# Patient Record
Sex: Female | Born: 1985 | Race: Asian | Hispanic: No | Marital: Single | Smoking: Never smoker
Health system: Southern US, Community
[De-identification: ages and names within clinical notes are randomized; demographics above are authoritative.]

---

## 2004-10-14 ENCOUNTER — Emergency Department (HOSPITAL_COMMUNITY): Admission: EM | Admit: 2004-10-14 | Discharge: 2004-10-15 | Payer: Self-pay | Admitting: Emergency Medicine

## 2005-08-02 ENCOUNTER — Emergency Department (HOSPITAL_COMMUNITY): Admission: EM | Admit: 2005-08-02 | Discharge: 2005-08-02 | Payer: Self-pay | Admitting: Emergency Medicine

## 2006-03-02 ENCOUNTER — Emergency Department (HOSPITAL_COMMUNITY): Admission: EM | Admit: 2006-03-02 | Discharge: 2006-03-02 | Payer: Self-pay | Admitting: Emergency Medicine

## 2006-04-27 ENCOUNTER — Emergency Department (HOSPITAL_COMMUNITY): Admission: EM | Admit: 2006-04-27 | Discharge: 2006-04-27 | Payer: Self-pay | Admitting: Emergency Medicine

## 2006-10-07 ENCOUNTER — Emergency Department (HOSPITAL_COMMUNITY): Admission: EM | Admit: 2006-10-07 | Discharge: 2006-10-07 | Payer: Self-pay | Admitting: Emergency Medicine

## 2007-07-23 ENCOUNTER — Emergency Department (HOSPITAL_COMMUNITY): Admission: EM | Admit: 2007-07-23 | Discharge: 2007-07-24 | Payer: Self-pay | Admitting: Emergency Medicine

## 2010-12-29 ENCOUNTER — Emergency Department (HOSPITAL_COMMUNITY)
Admission: EM | Admit: 2010-12-29 | Discharge: 2010-12-29 | Disposition: A | Discharge status: Home / Self Care | Payer: Self-pay | Attending: Emergency Medicine | Admitting: Emergency Medicine

## 2010-12-29 ENCOUNTER — Emergency Department (HOSPITAL_COMMUNITY): Payer: Self-pay

## 2010-12-29 DIAGNOSIS — R0789 Other chest pain: Secondary | ICD-10-CM | POA: Insufficient documentation

## 2010-12-29 DIAGNOSIS — R1031 Right lower quadrant pain: Secondary | ICD-10-CM | POA: Insufficient documentation

## 2010-12-29 DIAGNOSIS — B9689 Other specified bacterial agents as the cause of diseases classified elsewhere: Secondary | ICD-10-CM | POA: Insufficient documentation

## 2010-12-29 DIAGNOSIS — N949 Unspecified condition associated with female genital organs and menstrual cycle: Secondary | ICD-10-CM | POA: Insufficient documentation

## 2010-12-29 DIAGNOSIS — N760 Acute vaginitis: Secondary | ICD-10-CM | POA: Insufficient documentation

## 2010-12-29 DIAGNOSIS — R071 Chest pain on breathing: Secondary | ICD-10-CM | POA: Insufficient documentation

## 2010-12-29 DIAGNOSIS — A499 Bacterial infection, unspecified: Secondary | ICD-10-CM | POA: Insufficient documentation

## 2010-12-29 LAB — DIFFERENTIAL
Basophils Absolute: 0 10*3/uL (ref 0.0–0.1)
Lymphocytes Relative: 29 % (ref 12–46)
Monocytes Absolute: 0.4 10*3/uL (ref 0.1–1.0)
Monocytes Relative: 6 % (ref 3–12)
Neutro Abs: 4.3 10*3/uL (ref 1.7–7.7)

## 2010-12-29 LAB — CBC
HCT: 41.6 % (ref 36.0–46.0)
Hemoglobin: 14.4 g/dL (ref 12.0–15.0)
MCH: 32.5 pg (ref 26.0–34.0)
MCHC: 34.6 g/dL (ref 30.0–36.0)

## 2010-12-29 LAB — URINALYSIS, ROUTINE W REFLEX MICROSCOPIC
Bilirubin Urine: NEGATIVE
Hgb urine dipstick: NEGATIVE
Specific Gravity, Urine: 1.027 (ref 1.005–1.030)
pH: 6 (ref 5.0–8.0)

## 2010-12-29 LAB — POCT I-STAT, CHEM 8
BUN: 12 mg/dL (ref 6–23)
Chloride: 104 mEq/L (ref 96–112)
Creatinine, Ser: 0.9 mg/dL (ref 0.50–1.10)
Glucose, Bld: 88 mg/dL (ref 70–99)
Potassium: 3.4 mEq/L — ABNORMAL LOW (ref 3.5–5.1)

## 2010-12-29 LAB — WET PREP, GENITAL
Trich, Wet Prep: NONE SEEN
Yeast Wet Prep HPF POC: NONE SEEN

## 2010-12-29 LAB — CK TOTAL AND CKMB (NOT AT ARMC): CK, MB: 1.9 ng/mL (ref 0.3–4.0)

## 2010-12-29 LAB — TROPONIN I: Troponin I: <0.3 ng/mL (ref <0.30)

## 2010-12-30 LAB — GC/CHLAMYDIA PROBE AMP, GENITAL: GC Probe Amp, Genital: NEGATIVE

## 2011-03-17 LAB — POCT CARDIAC MARKERS
CKMB, poc: <1 — ABNORMAL LOW
CKMB, poc: <1 — ABNORMAL LOW
Myoglobin, poc: 16.2
Operator id: 4661
Operator id: 4661
Troponin i, poc: <0.05
Troponin i, poc: <0.05

## 2011-03-17 LAB — DIFFERENTIAL
Lymphocytes Relative: 29
Lymphs Abs: 5.2 — ABNORMAL HIGH
Monocytes Relative: 6
Neutro Abs: 11.5 — ABNORMAL HIGH
Neutrophils Relative %: 64

## 2011-03-17 LAB — CBC
MCV: 93.8
Platelets: 340
RBC: 4.59
WBC: 18 — ABNORMAL HIGH

## 2011-03-17 LAB — BASIC METABOLIC PANEL
CO2: 26
Calcium: 9.5
GFR calc non Af Amer: >60
Sodium: 137

## 2011-03-17 LAB — RAPID URINE DRUG SCREEN, HOSP PERFORMED
Benzodiazepines: NOT DETECTED
Tetrahydrocannabinol: POSITIVE — AB

## 2011-03-17 LAB — D-DIMER, QUANTITATIVE

## 2012-07-18 ENCOUNTER — Emergency Department (HOSPITAL_COMMUNITY): Payer: Self-pay

## 2012-07-18 ENCOUNTER — Encounter (HOSPITAL_COMMUNITY): Payer: Self-pay

## 2012-07-18 ENCOUNTER — Emergency Department (HOSPITAL_COMMUNITY)
Admission: EM | Admit: 2012-07-18 | Discharge: 2012-07-18 | Disposition: A | Discharge status: Home / Self Care | Payer: Self-pay | Attending: Emergency Medicine | Admitting: Emergency Medicine

## 2012-07-18 DIAGNOSIS — J069 Acute upper respiratory infection, unspecified: Secondary | ICD-10-CM | POA: Insufficient documentation

## 2012-07-18 DIAGNOSIS — R059 Cough, unspecified: Secondary | ICD-10-CM | POA: Insufficient documentation

## 2012-07-18 DIAGNOSIS — R05 Cough: Secondary | ICD-10-CM | POA: Insufficient documentation

## 2012-07-18 DIAGNOSIS — F17200 Nicotine dependence, unspecified, uncomplicated: Secondary | ICD-10-CM | POA: Insufficient documentation

## 2012-07-18 DIAGNOSIS — M542 Cervicalgia: Secondary | ICD-10-CM | POA: Insufficient documentation

## 2012-07-18 DIAGNOSIS — R0602 Shortness of breath: Secondary | ICD-10-CM | POA: Insufficient documentation

## 2012-07-18 DIAGNOSIS — J3489 Other specified disorders of nose and nasal sinuses: Secondary | ICD-10-CM | POA: Insufficient documentation

## 2012-07-18 MED ORDER — ALBUTEROL SULFATE HFA 108 (90 BASE) MCG/ACT IN AERS
2.0000 | INHALATION_SPRAY | Freq: Once | RESPIRATORY_TRACT | Status: AC
  Administered 2012-07-18: 2 via RESPIRATORY_TRACT
  Filled 2012-07-18: qty 6.7

## 2012-07-18 MED ORDER — IPRATROPIUM BROMIDE 0.03 % NA SOLN: 2.0000 | Freq: Two times a day (BID) | NASAL | Status: DC

## 2012-07-18 MED ORDER — GUAIFENESIN ER 600 MG PO TB12: 1200.0000 mg | ORAL_TABLET | Freq: Two times a day (BID) | ORAL | Status: DC

## 2012-07-18 MED ORDER — BENZONATATE 100 MG PO CAPS
200.0000 mg | ORAL_CAPSULE | Freq: Two times a day (BID) | ORAL | Status: DC | PRN
Start: 2012-07-18 — End: 2021-07-12

## 2012-07-18 NOTE — ED Provider Notes (Signed)
History   Scribed for Desiree Anger, DO, the patient was seen in room TR07C/TR07C . This chart was scribed by Lewanda Rife.   CSN: [id number]  Arrival date & time 07/18/12  1758   First MD Initiated Contact with Patient 07/18/12 1957      Chief Complaint  Patient presents with  . URI    (Consider location/radiation/quality/duration/timing/severity/associated sxs/prior treatment) HPI Keona Bilyeu is a 27 y.o. female without known past medical Hx who presents to the Emergency Department complaining of cough and congestion onset 3 days. Pt reports waxing and waning moderate headache, chest wall pain with inspiration, and, non-productive cough, difficulty breathing. Pt reports pain radiates to ribs and neck when taking a deep breath. Pt describes pain as burning and tightness and rated at a 7/10 in severity. Pt denies fever, nausea, vomiting, diarrhea, urinary symptoms, sore throat, and myalgias. Pt denies taking any medications to treat symptoms at home. Nothing makes it better and nothing makes it worse.  Pt reports her friend is sick with URI symptoms.   History reviewed. No pertinent past medical history.  History reviewed. No pertinent past surgical history.  No family history on file.  History  Substance Use Topics  . Smoking status: Current Every Day Smoker  . Smokeless tobacco: Not on file  . Alcohol Use: Yes    OB History    Grav Para Term Preterm Abortions TAB SAB Ect Mult Living                  Review of Systems  Constitutional: Negative for fever and chills.  HENT: Positive for neck pain. Negative for sore throat.   Respiratory: Positive for cough and shortness of breath.   Gastrointestinal: Negative for nausea, vomiting, abdominal pain and diarrhea.  Genitourinary: Negative.  Negative for dysuria and hematuria.  Musculoskeletal:       Chest wall pain  All other systems reviewed and are negative.    Allergies  Review of patient's allergies  indicates no known allergies.  Home Medications   Current Outpatient Rx  Name  Route  Sig  Dispense  Refill  . BENZONATATE 100 MG PO CAPS   Oral   Take 2 capsules (200 mg total) by mouth 2 (two) times daily as needed for cough.   20 capsule   0   . GUAIFENESIN ER 600 MG PO TB12   Oral   Take 2 tablets (1,200 mg total) by mouth 2 (two) times daily.   30 tablet   0   . IPRATROPIUM BROMIDE 0.03 % NA SOLN   Nasal   Place 2 sprays into the nose every 12 (twelve) hours.   30 mL   12     BP 118/81  Pulse 85  Temp 98 F (36.7 C) (Oral)  Resp 16  SpO2 100%  LMP 06/27/2012  Physical Exam  Nursing note and vitals reviewed. Constitutional: She is oriented to person, place, and time. She appears well-developed and well-nourished. No distress.  HENT:  Head: Normocephalic and atraumatic.  Right Ear: External ear and ear canal normal. Tympanic membrane is not erythematous and not bulging.  Left Ear: Tympanic membrane, external ear and ear canal normal. Tympanic membrane is not erythematous and not bulging.  Nose: Mucosal edema and rhinorrhea present. No epistaxis. Right sinus exhibits no maxillary sinus tenderness and no frontal sinus tenderness. Left sinus exhibits no maxillary sinus tenderness and no frontal sinus tenderness.  Mouth/Throat: Uvula is midline and mucous membranes are normal. Mucous  membranes are not pale and not cyanotic. Posterior oropharyngeal erythema (mild ) present. No oropharyngeal exudate, posterior oropharyngeal edema or tonsillar abscesses.  Eyes: Conjunctivae normal are normal. Pupils are equal, round, and reactive to light.  Neck: Normal range of motion and full passive range of motion without pain. Neck supple.       No meningismus noted on exam.  Cardiovascular: Normal rate and intact distal pulses.   Pulmonary/Chest: Effort normal and breath sounds normal. No stridor. No respiratory distress. She has no wheezes. She has no rales. She exhibits tenderness.           Breath sounds are diminished and coarse  Abdominal: Soft. Bowel sounds are normal. There is tenderness (mild) in the right upper quadrant, epigastric area and left upper quadrant.  Musculoskeletal: Normal range of motion.  Lymphadenopathy:    She has no cervical adenopathy.  Neurological: She is alert and oriented to person, place, and time.  Skin: Skin is warm and dry. No rash noted. She is not diaphoretic.  Psychiatric: She has a normal mood and affect.    ED Course  Procedures (including critical care time)  Labs Reviewed - No data to display Dg Chest 2 View  07/18/2012  *RADIOLOGY REPORT*  Clinical Data: Cough and shortness of breath  CHEST - 2 VIEW  Comparison:  December 29, 2010  Findings:  Lungs clear.  Heart size and pulmonary vascularity are normal.  No adenopathy.  No bone lesions.  MPRESSION: No abnormality noted.   Original Report Authenticated By: Bretta Bang, M.D.      1. Viral URI with cough       MDM  Noland Fordyce presents with URI symptoms and sick contacts.  Pt CXR negative for acute infiltrate. Patients symptoms are consistent with URI, likely viral etiology. Patient given albuterol treatment here in the department with subjective improvement. Increased catabolic a.m. and decreased coarseness of breath sounds reassessment. Discussed that antibiotics are not indicated for viral infections. Pt will be discharged with symptomatic treatment.  Verbalizes understanding and is agreeable with plan. Pt is hemodynamically stable & in NAD prior to dc.  I have also discussed reasons to return immediately to the ER.  Patient expresses understanding and agrees with plan.   1. Medications: naprosyn, Zofran usual home medications 2. Treatment: rest, drink plenty of fluids, take medications as prescribed 3. Follow Up: Please followup with your primary doctor for discussion of your diagnoses and further evaluation after today's visit; if you do not have a primary care doctor  use the resource guide provided to find one  I personally performed the services described in this documentation, which was scribed in my presence. The recorded information has been reviewed and is accurate.   Dahlia Client Don Giarrusso, PA-C 07/18/12 2149

## 2012-07-18 NOTE — ED Notes (Signed)
Chest congestion, cough, neck  Pain, stuffy nose.

## 2012-07-19 NOTE — ED Provider Notes (Signed)
Medical screening examination/treatment/procedure(s) were performed by non-physician practitioner and as supervising physician I was immediately available for consultation/collaboration.   Laray Anger, DO 07/19/12 1610

## 2021-01-19 ENCOUNTER — Emergency Department (HOSPITAL_COMMUNITY): Payer: BC Managed Care – PPO

## 2021-01-19 ENCOUNTER — Emergency Department (HOSPITAL_COMMUNITY)
Admission: EM | Admit: 2021-01-19 | Discharge: 2021-01-19 | Discharge status: Against Medical Advice | Payer: BC Managed Care – PPO | Attending: Emergency Medicine | Admitting: Emergency Medicine

## 2021-01-19 ENCOUNTER — Other Ambulatory Visit: Payer: Self-pay

## 2021-01-19 DIAGNOSIS — R519 Headache, unspecified: Secondary | ICD-10-CM | POA: Insufficient documentation

## 2021-01-19 DIAGNOSIS — R102 Pelvic and perineal pain: Secondary | ICD-10-CM | POA: Insufficient documentation

## 2021-01-19 DIAGNOSIS — R079 Chest pain, unspecified: Secondary | ICD-10-CM | POA: Diagnosis present

## 2021-01-19 LAB — BASIC METABOLIC PANEL
Anion gap: 8 (ref 5–15)
BUN: 12 mg/dL (ref 6–20)
CO2: 23 mmol/L (ref 22–32)
Calcium: 9.3 mg/dL (ref 8.9–10.3)
Chloride: 103 mmol/L (ref 98–111)
Creatinine, Ser: 0.82 mg/dL (ref 0.44–1.00)
GFR, Estimated: >60 mL/min (ref >60)
Glucose, Bld: 93 mg/dL (ref 70–99)
Potassium: 3.6 mmol/L (ref 3.5–5.1)
Sodium: 134 mmol/L — ABNORMAL LOW (ref 135–145)

## 2021-01-19 LAB — I-STAT BETA HCG BLOOD, ED (MC, WL, AP ONLY): I-stat hCG, quantitative: <5 m[IU]/mL (ref <5)

## 2021-01-19 LAB — CBC
HCT: 46.2 % — ABNORMAL HIGH (ref 36.0–46.0)
Hemoglobin: 15.2 g/dL — ABNORMAL HIGH (ref 12.0–15.0)
MCH: 31.6 pg (ref 26.0–34.0)
MCHC: 32.9 g/dL (ref 30.0–36.0)
MCV: 96 fL (ref 80.0–100.0)
Platelets: 343 10*3/uL (ref 150–400)
RBC: 4.81 MIL/uL (ref 3.87–5.11)
RDW: 12.5 % (ref 11.5–15.5)
WBC: 4.6 10*3/uL (ref 4.0–10.5)
nRBC: 0 % (ref 0.0–0.2)

## 2021-01-19 LAB — TROPONIN I (HIGH SENSITIVITY): Troponin I (High Sensitivity): <2 ng/L (ref <18)

## 2021-01-19 NOTE — ED Provider Notes (Signed)
She willEmergency Medicine Provider Triage Evaluation Note  Shylin Keizer , a 35 y.o. female  was evaluated in triage.  Pt complains of presents with chest pain, general body aches, headaches,.  This started last night, but has gotten worse, she denies shortness of breath, chest pain does not radiate, has no cardiac history, no history of PEs or DVTs, currently not on oral birth control, denies any illicit drug use.  Patient does not vaccines COVID-19, denies recent sick contacts..  Review of Systems  Positive: Chest pain, general body aches Negative: Shortness of breath, abdominal pain  Physical Exam  BP 114/82 (BP Location: Left Arm)   Pulse 91   Temp 98.9 F (37.2 C) (Oral)   Resp 16   Ht 5\' 7"  (1.702 m)   Wt 72.6 kg   LMP 12/20/2020 (Approximate)   SpO2 100%   BMI 25.06 kg/m  Gen:   Awake, no distress   Resp:  Normal effort  MSK:   Moves extremities without difficulty  Other:    Medical Decision Making  Medically screening exam initiated at 1:16 PM.  Appropriate orders placed.  Ayah Cozzolino was informed that the remainder of the evaluation will be completed by another provider, this initial triage assessment does not replace that evaluation, and the importance of remaining in the ED until their evaluation is complete.  Patient presents with chest pain general body aches patient will need further work-up here in the emergency department.   Noland Fordyce, PA-C 01/19/21 1317    01/21/21, MD 01/19/21 [id number]

## 2021-01-19 NOTE — ED Notes (Signed)
Pt called in ED lobby for vitals, no answer x1. ENMiles  

## 2021-01-19 NOTE — ED Notes (Signed)
Pt called for vitals reassessment in ED lobby. No answer x2. ENMiles  

## 2021-01-19 NOTE — ED Triage Notes (Signed)
Patient c/o left chest pain since last night and states worse today. Patient also reports generalized body aches and headache since last night.

## 2021-01-19 NOTE — ED Notes (Signed)
Pt called for vitals reassessment in ED lobby. No answer x3. ENMiles  

## 2021-06-15 ENCOUNTER — Ambulatory Visit: Payer: Self-pay | Admitting: Nurse Practitioner

## 2021-07-01 ENCOUNTER — Telehealth: Payer: Self-pay | Admitting: Family Medicine

## 2021-07-01 DIAGNOSIS — J020 Streptococcal pharyngitis: Secondary | ICD-10-CM

## 2021-07-01 MED ORDER — PENICILLIN V POTASSIUM 500 MG PO TABS
500.0000 mg | ORAL_TABLET | Freq: Two times a day (BID) | ORAL | 0 refills | Status: AC
Start: 2021-07-01 — End: 2021-07-11

## 2021-07-01 NOTE — Patient Instructions (Addendum)
Strep Throat, Adult Strep throat is an infection of the throat. It is caused by germs (bacteria). Strep throat is common during the cold months of the year. It mostly affects children who are 68-37 years old. However, people of all ages can get it at any time of the year. This infection spreads from person to person through coughing, sneezing, or having close contact. What are the causes? This condition is caused by the Streptococcus pyogenes germ. What increases the risk? You care for young children. Children are more likely to get strep throat and may spread it to others. You go to crowded places. Germs can spread easily in such places. You kiss or touch someone who has strep throat. What are the signs or symptoms? Fever or chills. Redness, swelling, or pain in the tonsils or throat. Pain or trouble when swallowing. White or yellow spots on the tonsils or throat. Tender glands in the neck and under the jaw. Bad breath. Red rash all over the body. This is rare. How is this treated? Medicines that kill germs (antibiotics). Medicines that treat pain or fever. These include: Ibuprofen or acetaminophen. Aspirin, only for people who are over the age of 33. Cough drops. Throat sprays. Follow these instructions at home: Medicines  Take over-the-counter and prescription medicines only as told by your doctor. Take your antibiotic medicine as told by your doctor. Do not stop taking the antibiotic even if you start to feel better. Eating and drinking  If you have trouble swallowing, eat soft foods until your throat feels better. Drink enough fluid to keep your pee (urine) pale yellow. To help with pain, you may have: Warm fluids, such as soup and tea. Cold fluids, such as frozen desserts or popsicles. General instructions Rinse your mouth (gargle) with a salt-water mixture 3-4 times a day or as needed. To make a salt-water mixture, dissolve -1 tsp (3-6 g) of salt in 1 cup (237 mL) of warm  water. Rest as much as you can. Stay home from work or school until you have been taking antibiotics for 24 hours. Do not smoke or use any products that contain nicotine or tobacco. If you need help quitting, ask your doctor. Keep all follow-up visits. How is this prevented?  Do not share food, drinking cups, or personal items. They can cause the germs to spread. Wash your hands well with soap and water. Make sure that all people in your house wash their hands well. Have family members tested if they have a fever or a sore throat. They may need an antibiotic if they have strep throat. Contact a doctor if: You have swelling in your neck that keeps getting bigger. You get a rash, cough, or earache. You cough up a thick fluid that is green, yellow-brown, or bloody. You have pain that does not get better with medicine. Your symptoms get worse instead of getting better. You have a fever. Get help right away if: You vomit. You have a very bad headache. Your neck hurts or feels stiff. You have chest pain or are short of breath. You have drooling, very bad throat pain, or changes in your voice. Your neck is swollen, or the skin gets red and tender. Your mouth is dry, or you are peeing less than normal. You keep feeling more tired or have trouble waking up. Your joints are red or painful. These symptoms may be an emergency. Do not wait to see if the symptoms will go away. Get help right away. Call  your local emergency services (911 in the [place] Summary Strep throat is an infection of the throat. It is caused by germs (bacteria). This infection can spread from person to person through coughing, sneezing, or having close contact. Take your medicines, including antibiotics, as told by your doctor. Do not stop taking the antibiotic even if you start to feel better. To prevent the spread of germs, wash your hands well with soap and water. Have others do the same. Do not share food, drinking cups,  or personal items. Get help right away if you have a bad headache, chest pain, shortness of breath, a stiff or painful neck, or you vomit. This information is not intended to replace advice given to you by your health care provider. Make sure you discuss any questions you have with your health care provider. Document Revised: 10/06/2020 Document Reviewed: 10/06/2020 Elsevier Patient Education  [place].   Penicillin V Tablets What is this medication? PENICILLIN V (pen i SILL in V) is a penicillin antibiotic. It treats some infections caused by bacteria. It will not work for colds, the flu, or other viruses. This medicine may be used for other purposes; ask your health care provider or pharmacist if you have questions. COMMON BRAND NAME(S): Bayard Beaver, Veetids What should I tell my care team before I take this medication? They need to know if you have any of these conditions: asthma bowel disease, like colitis eczema kidney disease an unusual or allergic reaction to penicillin, cephalosporins, other antibiotics or medicines, foods, tartrazine or other dyes, or preservatives pregnant or trying to get pregnant breast-feeding How should I use this medication? Take this drug by mouth. Take it as directed on the prescription label at the same time every day. You can take it with or without food. If it upsets your stomach, take it with food. Take all of this drug unless your health care provider tells you to stop it early. Keep taking it even if you think you are better. Talk to your health care provider about the use of this drug in children. While it may be prescribed for children as young as 12 for selected conditions, precautions do apply. Overdosage: If you think you have taken too much of this medicine contact a poison control center or emergency room at once. NOTE: This medicine is only for you. Do not share this medicine with others. What if I miss a dose? If you miss a dose,  take it as soon as you can. If it is almost time for your next dose, take only that dose. Do not take double or extra doses. What may interact with this medication? birth control pills methotrexate other antibiotics probenecid some vaccines This list may not describe all possible interactions. Give your health care provider a list of all the medicines, herbs, non-prescription drugs, or dietary supplements you use. Also tell them if you smoke, drink alcohol, or use illegal drugs. Some items may interact with your medicine. What should I watch for while using this medication? Tell your doctor or health care provider if your symptoms do not improve. This medicine may cause serious skin reactions. They can happen weeks to months after starting the medicine. Contact your health care provider right away if you notice fevers or flu-like symptoms with a rash. The rash may be red or purple and then turn into blisters or peeling of the skin. Or, you might notice a red rash with swelling of the face, lips or lymph nodes in  your neck or under your arms. Do not treat diarrhea with over the counter products. Contact your doctor if you have diarrhea that lasts more than 2 days or if it is severe and watery. If you have diabetes, you may get a false-positive result for sugar in your urine. Check with your doctor or health care provider. Birth control pills may not work properly while you are taking this medicine. Talk to your doctor about using an extra method of birth control. What side effects may I notice from receiving this medication? Side effects that you should report to your doctor or health care professional as soon as possible: allergic reactions like skin rash or hives, swelling of the face, lips, or tongue breathing problems fever new symptoms of infection redness, blistering, peeling or loosening of the skin, including inside the mouth unusually weak or tired Side effects that usually do not  require medical attention (report to your doctor or health care professional if they continue or are bothersome): diarrhea headache nausea, vomiting sore mouth or tongue stomach upset This list may not describe all possible side effects. Call your doctor for medical advice about side effects. You may report side effects to FDA at 1-800-FDA-1088. Where should I keep my medication? Keep out of the reach of children and pets. Store at room temperature between 20 and 25 degrees C (68 and 77 degrees F). Throw away any unused drug after the expiration date. NOTE: This sheet is a summary. It may not cover all possible information. If you have questions about this medicine, talk to your doctor, pharmacist, or health care provider.  2022 Elsevier/Gold Standard (2019-03-07 00:00:00)

## 2021-07-01 NOTE — Progress Notes (Signed)
Virtual Visit Consent   Desiree Murray, you are scheduled for a virtual visit with a [facility] provider today.     Just as with appointments in the office, your consent must be obtained to participate.  Your consent will be active for this visit and any virtual visit you may have with one of our providers in the next 365 days.     If you have a MyChart account, a copy of this consent can be sent to you electronically.  All virtual visits are billed to your insurance company just like a traditional visit in the office.    As this is a virtual visit, video technology does not allow for your provider to perform a traditional examination.  This may limit your provider's ability to fully assess your condition.  If your provider identifies any concerns that need to be evaluated in person or the need to arrange testing (such as labs, EKG, etc.), we will make arrangements to do so.     Although advances in technology are sophisticated, we cannot ensure that it will always work on either your end or our end.  If the connection with a video visit is poor, the visit may have to be switched to a telephone visit.  With either a video or telephone visit, we are not always able to ensure that we have a secure connection.     I need to obtain your verbal consent now.   Are you willing to proceed with your visit today?    [name] has provided verbal consent on 07/01/2021 for a virtual visit (video or telephone).   Freddy Finner, NP   Date: 07/01/2021 9:23 AM   Virtual Visit via Video Note   I, Freddy Finner, connected with  Desiree Murray  ([id number], [birth year]) on 07/01/21 at  9:30 AM EST by a video-enabled telemedicine application and verified that I am speaking with the correct person using two identifiers.  Location: Patient: Virtual Visit Location Patient: Home Provider: Virtual Visit Location Provider: Home Office   I discussed the limitations of evaluation and management by telemedicine and the  availability of in person appointments. The patient expressed understanding and agreed to proceed.    History of Present Illness: Desiree Murray is a 36 y.o. who identifies as a female who was assigned female at birth, and is being seen today for possible strep- sore throat.    HPI: Sore Throat  This is a new problem. The current episode started in the past 7 days. The problem has been gradually worsening. Neither side of throat is experiencing more pain than the other. Maximum temperature: unsure. The pain is at a severity of 10/10. Associated symptoms include headaches, neck pain, swollen glands and trouble swallowing. Pertinent negatives include no congestion, coughing, drooling, ear pain, shortness of breath or stridor. She has had no exposure to strep or mono. She has tried acetaminophen and cool liquids for the symptoms. The treatment provided mild relief.     Problems: There are no problems to display for this patient.   Allergies: No Known Allergies Medications:  Current Outpatient Medications:    benzonatate (TESSALON) 100 MG capsule, Take 2 capsules (200 mg total) by mouth 2 (two) times daily as needed for cough., Disp: 20 capsule, Rfl: 0   guaiFENesin (MUCINEX) 600 MG 12 hr tablet, Take 2 tablets (1,200 mg total) by mouth 2 (two) times daily., Disp: 30 tablet, Rfl: 0   ipratropium (ATROVENT) 0.03 % nasal spray, Place 2  sprays into the nose every 12 (twelve) hours., Disp: 30 mL, Rfl: 12  Observations/Objective: Patient is well-developed, well-nourished in no acute distress.  Resting comfortably  at home.  Head is normocephalic, atraumatic.  No labored breathing.  Speech is clear and coherent with logical content.  Patient is alert and oriented at baseline.    Assessment and Plan:  1. Strep pharyngitis  - penicillin v potassium (VEETID) 500 MG tablet; Take 1 tablet (500 mg total) by mouth 2 (two) times daily for 10 days.  Dispense: 20 tablet; Refill: 0  -unknown exposure-  classic presentation, will Tx with above and OTC reviewed and on AVS   Reviewed side effects, risks and benefits of medication.     Patient acknowledged agreement and understanding of the plan.     Follow Up Instructions: I discussed the assessment and treatment plan with the patient. The patient was provided an opportunity to ask questions and all were answered. The patient agreed with the plan and demonstrated an understanding of the instructions.  A copy of instructions were sent to the patient via MyChart unless otherwise noted below.    The patient was advised to call back or seek an in-person evaluation if the symptoms worsen or if the condition fails to improve as anticipated.  Time:  I spent 12 minutes with the patient via telehealth technology discussing the above problems/concerns.    Freddy Finner, NP

## 2021-07-07 ENCOUNTER — Other Ambulatory Visit: Payer: Self-pay

## 2021-07-07 ENCOUNTER — Encounter: Payer: Self-pay | Admitting: Nurse Practitioner

## 2021-07-07 ENCOUNTER — Ambulatory Visit (INDEPENDENT_AMBULATORY_CARE_PROVIDER_SITE_OTHER): Payer: PRIVATE HEALTH INSURANCE | Admitting: Nurse Practitioner

## 2021-07-07 VITALS — BP 106/70 | HR 74 | Temp 98.1°F | Ht 67.0 in | Wt 159.5 lb

## 2021-07-07 DIAGNOSIS — R110 Nausea: Secondary | ICD-10-CM

## 2021-07-07 DIAGNOSIS — G43009 Migraine without aura, not intractable, without status migrainosus: Secondary | ICD-10-CM | POA: Diagnosis not present

## 2021-07-07 DIAGNOSIS — Z7689 Persons encountering health services in other specified circumstances: Secondary | ICD-10-CM | POA: Diagnosis not present

## 2021-07-07 DIAGNOSIS — N926 Irregular menstruation, unspecified: Secondary | ICD-10-CM | POA: Diagnosis not present

## 2021-07-07 DIAGNOSIS — Z0000 Encounter for general adult medical examination without abnormal findings: Secondary | ICD-10-CM

## 2021-07-07 DIAGNOSIS — N951 Menopausal and female climacteric states: Secondary | ICD-10-CM

## 2021-07-07 DIAGNOSIS — Z113 Encounter for screening for infections with a predominantly sexual mode of transmission: Secondary | ICD-10-CM

## 2021-07-07 MED ORDER — ELETRIPTAN HYDROBROMIDE 20 MG PO TABS: 20.0000 mg | ORAL_TABLET | ORAL | 0 refills | Status: DC | PRN

## 2021-07-07 MED ORDER — ONDANSETRON 4 MG PO TBDP: 4.0000 mg | ORAL_TABLET | Freq: Three times a day (TID) | ORAL | 1 refills | Status: DC | PRN

## 2021-07-07 NOTE — Progress Notes (Signed)
New Patient Office Visit  Subjective:  Patient ID: Desiree Murray, female    DOB: Aug 14, 1985  Age: 36 y.o. MRN: [id number]  CC:  Chief Complaint  Patient presents with   Establish Care    HPI Briyonna Omara presents to establish new primary care provider. She states that she has headaches and nausea nearly every day. She states that the headaches just come out of no where. She states that OTC medications don't generally work. The nausea that accompanies nearly every headache is overwhelming.  Has irritation of the skin of her vaginal area. This has been going on for several years. Back in 2017, she was given steroids cream. Which helped some. This got better, then worse. She states that the cream she had initially been given is not working like it used to. She is due to have a routine physical exam.  He does have routine, fasting labs.  She would like to be screened for sexually transmitted infections.  History reviewed. No pertinent past medical history.  History reviewed. No pertinent surgical history.  History reviewed. No pertinent family history.  Social History   Socioeconomic History   Marital status: Single    Spouse name: Not on file   Number of children: Not on file   Years of education: Not on file   Highest education level: Not on file  Occupational History   Not on file  Tobacco Use   Smoking status: Never   Smokeless tobacco: Not on file  Substance and Sexual Activity   Alcohol use: Yes   Drug use: No   Sexual activity: Yes    Birth control/protection: None  Other Topics Concern   Not on file  Social History Narrative   Not on file   Social Determinants of Health   Financial Resource Strain: Not on file  Food Insecurity: Not on file  Transportation Needs: Not on file  Physical Activity: Not on file  Stress: Not on file  Social Connections: Not on file  Intimate Partner Violence: Not on file    ROS Review of Systems  Constitutional:  Positive for  fatigue. Negative for activity change, appetite change, chills and fever.  HENT:  Negative for congestion, postnasal drip, rhinorrhea, sinus pressure, sinus pain, sneezing and sore throat.   Eyes: Negative.   Respiratory:  Negative for cough, chest tightness, shortness of breath and wheezing.   Cardiovascular:  Negative for chest pain and palpitations.  Gastrointestinal:  Positive for nausea and vomiting. Negative for abdominal pain, constipation and diarrhea.       Nausea and vomiting accompanying migraine headaches.  Endocrine: Negative for cold intolerance, heat intolerance, polydipsia and polyuria.  Genitourinary:  Negative for dyspareunia, dysuria, flank pain, frequency and urgency.       Vaginal itching and irritation.  Musculoskeletal:  Negative for arthralgias, back pain and myalgias.  Skin:  Negative for rash.  Allergic/Immunologic: Negative for environmental allergies.  Neurological:  Positive for dizziness and headaches. Negative for weakness.  Hematological:  Negative for adenopathy.  Psychiatric/Behavioral:  The patient is nervous/anxious.    Objective:   Today's Vitals   07/07/21 1146  BP: 106/70  Pulse: 74  Temp: 98.1 F (36.7 C)  SpO2: 99%  Weight: 159 lb 8 oz (72.3 kg)  Height: $Remove'5\' 7"'HbtFDHr$  (1.702 m)   Body mass index is 24.98 kg/m.   Physical Exam Vitals and nursing note reviewed.  Constitutional:      Appearance: Normal appearance. She is well-developed.  HENT:  Head: Normocephalic and atraumatic.  Eyes:     Pupils: Pupils are equal, round, and reactive to light.  Cardiovascular:     Rate and Rhythm: Normal rate and regular rhythm.     Pulses: Normal pulses.     Heart sounds: Normal heart sounds.  Pulmonary:     Effort: Pulmonary effort is normal.     Breath sounds: Normal breath sounds.  Abdominal:     Palpations: Abdomen is soft.  Musculoskeletal:        General: Normal range of motion.     Cervical back: Normal range of motion and neck supple.   Lymphadenopathy:     Cervical: No cervical adenopathy.  Skin:    General: Skin is warm and dry.     Capillary Refill: Capillary refill takes less than 2 seconds.  Neurological:     General: No focal deficit present.     Mental Status: She is alert and oriented to person, place, and time.  Psychiatric:        Mood and Affect: Mood normal.        Behavior: Behavior normal.        Thought Content: Thought content normal.        Judgment: Judgment normal.    Assessment & Plan:  1. Encounter to establish care Appointment today to establish new primary care provider    2. Migraine without aura and without status migrainosus, not intractable Patient reports having headaches typically every day.  We will do trial of Relpax 20 mg tablets.  Advised to take first tablet at headache onset.  May repeat dose after 2 hours if headache is persistent.  She should not take more than 2 tablets in 24 hours.  We will reassess headache frequency at next visit. - eletriptan (RELPAX) 20 MG tablet; headaches 1 tablet (20 mg total) by mouth as needed for migraine or headache. May repeat in 2 hours if headache persists or recurs.  Dispense: 10 tablet; Refill: 0  3. Nausea Nausea associated with migraine headaches.  May take Zofran 3 to 4 mg up to 3 times daily if needed for nausea. - ondansetron (ZOFRAN-ODT) 4 MG disintegrating tablet; Take 1 tablet (4 mg total) by mouth every 8 (eight) hours as needed for nausea or vomiting.  Dispense: 30 tablet; Refill: 1  4. Irregular menstrual cycle Patient having irregular menstrual cycles.  We will check reproductive hormones and discuss potential treatments once labs are back.  5. Perimenopause Will check reproductive hormones for further evaluation. - FSH/LH; Future - Estradiol; Future - Estradiol - FSH/LH  6. Screening for STD (sexually transmitted disease) Check HIV and RPR.  Check gonorrhea, chlamydia, and trichomonas at next visit. - HIV antibody (with  reflex); Future - RPR; Future - HIV antibody (with reflex) - RPR  7. Health care maintenance Check all routine, fasting labs today. - Hemoglobin A1c; Future - TSH; Future - Lipid panel; Future - Comp Met (CMET); Future - CBC; Future - CBC - Comp Met (CMET) - Lipid panel - TSH - Hemoglobin A1c   Problem List Items Addressed This Visit       Cardiovascular and Mediastinum   Migraine without aura and without status migrainosus, not intractable   Relevant Medications   eletriptan (RELPAX) 20 MG tablet     Other   Nausea   Relevant Medications   ondansetron (ZOFRAN-ODT) 4 MG disintegrating tablet   Irregular menstrual cycle   Perimenopause   Relevant Orders   FSH/LH (Completed)  Estradiol (Completed)   Other Visit Diagnoses     Encounter to establish care    -  Primary   Screening for STD (sexually transmitted disease)       Relevant Orders   HIV antibody (with reflex) (Completed)   RPR (Completed)   Health care maintenance       Relevant Orders   Hemoglobin A1c (Completed)   TSH (Completed)   Lipid panel (Completed)   Comp Met (CMET) (Completed)   CBC (Completed)       Outpatient Encounter Medications as of 07/07/2021  Medication Sig   eletriptan (RELPAX) 20 MG tablet Take 1 tablet (20 mg total) by mouth as needed for migraine or headache. May repeat in 2 hours if headache persists or recurs.   ondansetron (ZOFRAN-ODT) 4 MG disintegrating tablet Take 1 tablet (4 mg total) by mouth every 8 (eight) hours as needed for nausea or vomiting.   [EXPIRED] penicillin v potassium (VEETID) 500 MG tablet Take 1 tablet (500 mg total) by mouth 2 (two) times daily for 10 days.   benzonatate (TESSALON) 100 MG capsule Take 2 capsules (200 mg total) by mouth 2 (two) times daily as needed for cough.   guaiFENesin (MUCINEX) 600 MG 12 hr tablet Take 2 tablets (1,200 mg total) by mouth 2 (two) times daily.   ipratropium (ATROVENT) 0.03 % nasal spray Place 2 sprays into the nose  every 12 (twelve) hours.   No facility-administered encounter medications on file as of 07/07/2021.    Follow-up: Return in about 2 weeks (around 07/21/2021) for health maintenance exam, with pap.   Ronnell Freshwater, NP  This note was dictated using Systems analyst. Rapid proofreading was performed to expedite the delivery of the information. Despite proofreading, phonetic errors will occur which are common with this voice recognition software. Please take this into consideration. If there are any concerns, please contact our office.

## 2021-07-08 ENCOUNTER — Telehealth: Payer: Self-pay | Admitting: Nurse Practitioner

## 2021-07-08 LAB — LIPID PANEL
Chol/HDL Ratio: 5.5 ratio — ABNORMAL HIGH (ref 0.0–4.4)
Cholesterol, Total: 188 mg/dL (ref 100–199)
HDL: 34 mg/dL — ABNORMAL LOW (ref >39)
LDL Chol Calc (NIH): 120 mg/dL — ABNORMAL HIGH (ref 0–99)
Triglycerides: 189 mg/dL — ABNORMAL HIGH (ref 0–149)
VLDL Cholesterol Cal: 34 mg/dL (ref 5–40)

## 2021-07-08 LAB — CBC
Hematocrit: 44.5 % (ref 34.0–46.6)
Hemoglobin: 15.2 g/dL (ref 11.1–15.9)
MCH: 31.9 pg (ref 26.6–33.0)
MCHC: 34.2 g/dL (ref 31.5–35.7)
MCV: 94 fL (ref 79–97)
Platelets: 502 10*3/uL — ABNORMAL HIGH (ref 150–450)
RBC: 4.76 x10E6/uL (ref 3.77–5.28)
RDW: 12.5 % (ref 11.7–15.4)
WBC: 8.9 10*3/uL (ref 3.4–10.8)

## 2021-07-08 LAB — HIV ANTIBODY (ROUTINE TESTING W REFLEX): HIV Screen 4th Generation wRfx: NONREACTIVE

## 2021-07-08 LAB — RPR: RPR Ser Ql: NONREACTIVE

## 2021-07-08 LAB — COMPREHENSIVE METABOLIC PANEL
ALT: 15 IU/L (ref 0–32)
AST: 17 IU/L (ref 0–40)
Albumin/Globulin Ratio: 1.6 (ref 1.2–2.2)
Albumin: 4.6 g/dL (ref 3.8–4.8)
Alkaline Phosphatase: 65 IU/L (ref 44–121)
BUN/Creatinine Ratio: 10 (ref 9–23)
BUN: 8 mg/dL (ref 6–20)
Bilirubin Total: 0.3 mg/dL (ref 0.0–1.2)
CO2: 24 mmol/L (ref 20–29)
Calcium: 9.8 mg/dL (ref 8.7–10.2)
Chloride: 100 mmol/L (ref 96–106)
Creatinine, Ser: 0.84 mg/dL (ref 0.57–1.00)
Globulin, Total: 2.9 g/dL (ref 1.5–4.5)
Glucose: 86 mg/dL (ref 70–99)
Potassium: 5.5 mmol/L — ABNORMAL HIGH (ref 3.5–5.2)
Sodium: 138 mmol/L (ref 134–144)
Total Protein: 7.5 g/dL (ref 6.0–8.5)
eGFR: 92 mL/min/{1.73_m2} (ref >59)

## 2021-07-08 LAB — TSH: TSH: 0.437 u[IU]/mL — ABNORMAL LOW (ref 0.450–4.500)

## 2021-07-08 LAB — FSH/LH
FSH: 4.2 m[IU]/mL
LH: 9.8 m[IU]/mL

## 2021-07-08 LAB — HEMOGLOBIN A1C
Est. average glucose Bld gHb Est-mCnc: 111 mg/dL
Hgb A1c MFr Bld: 5.5 % (ref 4.8–5.6)

## 2021-07-08 LAB — ESTRADIOL: Estradiol: 158 pg/mL

## 2021-07-08 NOTE — Telephone Encounter (Signed)
Appointment scheduled.

## 2021-07-08 NOTE — Progress Notes (Signed)
Will review results with patient at visit 07/21/2021. Not menopausal. May ned referral to GYN and endocrinology. RPR and HIV both negative.

## 2021-07-08 NOTE — Telephone Encounter (Signed)
Patient left vm saying she had some questions about lab results that came to her MyChart. Can you please call her? [phone]

## 2021-07-09 ENCOUNTER — Other Ambulatory Visit: Payer: Self-pay

## 2021-07-09 ENCOUNTER — Telehealth (INDEPENDENT_AMBULATORY_CARE_PROVIDER_SITE_OTHER): Payer: PRIVATE HEALTH INSURANCE | Admitting: Nurse Practitioner

## 2021-07-09 ENCOUNTER — Encounter: Payer: Self-pay | Admitting: Nurse Practitioner

## 2021-07-09 VITALS — Ht 67.0 in | Wt 159.5 lb

## 2021-07-09 DIAGNOSIS — E782 Mixed hyperlipidemia: Secondary | ICD-10-CM | POA: Diagnosis not present

## 2021-07-09 DIAGNOSIS — E875 Hyperkalemia: Secondary | ICD-10-CM | POA: Diagnosis not present

## 2021-07-09 DIAGNOSIS — R7989 Other specified abnormal findings of blood chemistry: Secondary | ICD-10-CM | POA: Diagnosis not present

## 2021-07-09 NOTE — Progress Notes (Signed)
Virtual Visit via Video Note  I connected with Desiree Murray on 07/09/21 at  9:20 AM EST by a video enabled telemedicine application and verified that I am speaking with the correct person using two identifiers.  Location: Patient: home Provider: [place] [facility] at [facility]     I discussed the limitations of evaluation and management by telemedicine and the availability of in person appointments. The patient expressed understanding and agreed to proceed.  History of Present Illness: The patient presents for follow up visit regarding her labs. She had fasting lipids drawn. In general, liid panel was elevated with an increase in risk for cardiovascular disease.   Lipid Panel     Component Value Date/Time   CHOL 188 07/07/2021 1153   TRIG 189 (H) 07/07/2021 1153   HDL 34 (L) 07/07/2021 1153   CHOLHDL 5.5 (H) 07/07/2021 1153   LDLCALC 120 (H) 07/07/2021 1153   LABVLDL 34 07/07/2021 1153    Potassium was elevated at 5.5 with the remainder of her CMP normal.  Platelet count was elevated at 502.  Her TSH was slightly low at 0.437 Other labs were within normal limits. The patient has no physical concerns or complaints.    Observations/Objective:  The patient is alert and oriented. She is pleasant and answers all questions appropriately. Breathing is non-labored. She is in no acute distress at this time.    Today's Vitals   07/09/21 0908  Weight: 159 lb 8 oz (72.3 kg)  Height: 5\' 7"  (1.702 m)   Body mass index is 24.98 kg/m.   Assessment and Plan: 1. Mixed hyperlipidemia Discussed high cholesterol panel with the patient. Encouraged her to limit her intake of fried and fatty foods and increase intake of lean proteins and green leafy vegetables. I have also recommended she increase physical activity. We will conitnue to monitor this closely and reconsider addition of statins if indicated in the future.   2. Abnormal TSH TSH mildly low at 0.437. will recheck this at  patient's next visit which is 07/21/2021.   3. High platelet count Platelet count 503. Will recheck CBC at next visit   4. Hyperkalemia Potassium level 5.5 with the remainder of CMP being normal. Will recheck BMP at next visit on 07/21/2021.    Follow Up Instructions:    I discussed the assessment and treatment plan with the patient. The patient was provided an opportunity to ask questions and all were answered. The patient agreed with the plan and demonstrated an understanding of the instructions.   The patient was advised to call back or seek an in-person evaluation if the symptoms worsen or if the condition fails to improve as anticipated.  I provided 15 minutes of non-face-to-face time during this encounter.   07/23/2021, NP

## 2021-07-12 DIAGNOSIS — R110 Nausea: Secondary | ICD-10-CM | POA: Insufficient documentation

## 2021-07-12 DIAGNOSIS — N926 Irregular menstruation, unspecified: Secondary | ICD-10-CM | POA: Insufficient documentation

## 2021-07-12 DIAGNOSIS — G43009 Migraine without aura, not intractable, without status migrainosus: Secondary | ICD-10-CM | POA: Insufficient documentation

## 2021-07-12 DIAGNOSIS — N951 Menopausal and female climacteric states: Secondary | ICD-10-CM | POA: Insufficient documentation

## 2021-07-21 ENCOUNTER — Other Ambulatory Visit (HOSPITAL_COMMUNITY)
Admission: RE | Admit: 2021-07-21 | Discharge: 2021-07-21 | Disposition: A | Discharge status: Home / Self Care | Payer: PRIVATE HEALTH INSURANCE | Source: Ambulatory Visit | Attending: Nurse Practitioner | Admitting: Nurse Practitioner

## 2021-07-21 ENCOUNTER — Encounter: Payer: Self-pay | Admitting: Nurse Practitioner

## 2021-07-21 ENCOUNTER — Other Ambulatory Visit: Payer: Self-pay

## 2021-07-21 ENCOUNTER — Ambulatory Visit (INDEPENDENT_AMBULATORY_CARE_PROVIDER_SITE_OTHER): Payer: PRIVATE HEALTH INSURANCE | Admitting: Nurse Practitioner

## 2021-07-21 VITALS — BP 118/69 | HR 60 | Temp 98.1°F | Ht 67.0 in | Wt 162.6 lb

## 2021-07-21 DIAGNOSIS — G43009 Migraine without aura, not intractable, without status migrainosus: Secondary | ICD-10-CM

## 2021-07-21 DIAGNOSIS — R7989 Other specified abnormal findings of blood chemistry: Secondary | ICD-10-CM

## 2021-07-21 DIAGNOSIS — Z01419 Encounter for gynecological examination (general) (routine) without abnormal findings: Secondary | ICD-10-CM | POA: Insufficient documentation

## 2021-07-21 DIAGNOSIS — E663 Overweight: Secondary | ICD-10-CM

## 2021-07-21 DIAGNOSIS — E875 Hyperkalemia: Secondary | ICD-10-CM | POA: Diagnosis not present

## 2021-07-21 DIAGNOSIS — L209 Atopic dermatitis, unspecified: Secondary | ICD-10-CM

## 2021-07-21 MED ORDER — ELETRIPTAN HYDROBROMIDE 40 MG PO TABS: 40.0000 mg | ORAL_TABLET | ORAL | 1 refills | Status: AC | PRN

## 2021-07-21 MED ORDER — CLOBETASOL PROP EMOLLIENT BASE 0.05 % EX CREA: TOPICAL_CREAM | CUTANEOUS | 2 refills | Status: AC

## 2021-07-21 NOTE — Assessment & Plan Note (Signed)
Increase relpax to 40mg  as needed for migraine headaches.

## 2021-07-21 NOTE — Progress Notes (Signed)
Complete physical exam   Patient: Desiree Murray   DOB: 1985/09/19   36 y.o. Female  MRN: [id number] Visit Date: 07/21/2021    Chief Complaint  Patient presents with   Annual Exam   Gynecologic Exam   Subjective    Desiree Murray is a 36 y.o. female who presents today for a complete physical exam.  She reports consuming a general diet. The patient does not participate in regular exercise at present. She generally feels fairly well. She does have additional problems to discuss today.   HPI  States that she had intercourse yesterday. States that she had some spotting after that. Did not have cramping or other concerning symptoms.  Feels fatigued most of the time  We had reviewed routine, fasting labs prior tothis visit. Will check full thyroid panel today and BMP as potassium level was 5.5.  Was able to take a relpax for migraine headaches. She states that it has helped some, but has had to take [facility] powder with this as well.   History reviewed. No pertinent past medical history. History reviewed. No pertinent surgical history.  Social History   Socioeconomic History   Marital status: Single    Spouse name: Not on file   Number of children: Not on file   Years of education: Not on file   Highest education level: Not on file  Occupational History   Not on file  Tobacco Use   Smoking status: Never   Smokeless tobacco: Not on file  Substance and Sexual Activity   Alcohol use: Yes   Drug use: No   Sexual activity: Yes    Birth control/protection: None  Other Topics Concern   Not on file  Social History Narrative   Not on file   Social Determinants of Health   Financial Resource Strain: Not on file  Food Insecurity: Not on file  Transportation Needs: Not on file  Physical Activity: Not on file  Stress: Not on file  Social Connections: Not on file  Intimate Partner Violence: Not on file   No family status information on file.   History reviewed. No pertinent family  history. No Known Allergies  Patient Care Team: Ronnell Freshwater, NP as PCP - General (Family Medicine)   Medications: Outpatient Medications Prior to Visit  Medication Sig   ondansetron (ZOFRAN-ODT) 4 MG disintegrating tablet Take 1 tablet (4 mg total) by mouth every 8 (eight) hours as needed for nausea or vomiting.   [DISCONTINUED] eletriptan (RELPAX) 20 MG tablet Take 1 tablet (20 mg total) by mouth as needed for migraine or headache. May repeat in 2 hours if headache persists or recurs.   No facility-administered medications prior to visit.    Review of Systems  Constitutional:  Positive for fatigue. Negative for activity change, appetite change, chills and fever.  HENT:  Negative for congestion, postnasal drip, rhinorrhea, sinus pressure, sinus pain, sneezing and sore throat.   Eyes: Negative.   Respiratory:  Negative for cough, chest tightness, shortness of breath and wheezing.   Cardiovascular:  Negative for chest pain and palpitations.  Gastrointestinal:  Negative for abdominal pain, constipation, diarrhea, nausea and vomiting.  Endocrine: Negative for cold intolerance, heat intolerance, polydipsia and polyuria.  Genitourinary:  Positive for menstrual problem. Negative for dyspareunia, dysuria, flank pain, frequency and urgency.       Had some spotting after intercourse yesterday   Musculoskeletal:  Negative for arthralgias, back pain and myalgias.  Skin:  Negative for rash.  Allergic/Immunologic: Negative for  environmental allergies.  Neurological:  Positive for headaches. Negative for dizziness and weakness.  Hematological:  Negative for adenopathy.  Psychiatric/Behavioral:  The patient is not nervous/anxious.    Last metabolic panel Lab Results  Component Value Date   GLUCOSE 86 07/07/2021   NA 138 07/07/2021   K 5.5 (H) 07/07/2021   CL 100 07/07/2021   CO2 24 07/07/2021   BUN 8 07/07/2021   CREATININE 0.84 07/07/2021   EGFR 92 07/07/2021   CALCIUM 9.8 07/07/2021    PROT 7.5 07/07/2021   ALBUMIN 4.6 07/07/2021   LABGLOB 2.9 07/07/2021   AGRATIO 1.6 07/07/2021   BILITOT 0.3 07/07/2021   ALKPHOS 65 07/07/2021   AST 17 07/07/2021   ALT 15 07/07/2021   ANIONGAP 8 01/19/2021   Last thyroid functions Lab Results  Component Value Date   TSH 0.437 (L) 07/07/2021       Objective     Today's Vitals   07/21/21 0813  BP: 118/69  Pulse: 60  Temp: 98.1 F (36.7 C)  SpO2: 99%  Weight: 162 lb 9.6 oz (73.8 kg)  Height: $Remove'5\' 7"'QkzSTqp$  (1.702 m)   Body mass index is 25.47 kg/m.   BP Readings from Last 3 Encounters:  07/21/21 118/69  07/07/21 106/70  01/19/21 110/73    Wt Readings from Last 3 Encounters:  07/21/21 162 lb 9.6 oz (73.8 kg)  07/09/21 159 lb 8 oz (72.3 kg)  07/07/21 159 lb 8 oz (72.3 kg)     Physical Exam Vitals and nursing note reviewed. Exam conducted with a chaperone present.  Constitutional:      Appearance: Normal appearance. She is well-developed.  HENT:     Head: Normocephalic and atraumatic.     Right Ear: Tympanic membrane, ear canal and external ear normal.     Left Ear: Tympanic membrane, ear canal and external ear normal.     Nose: Nose normal.     Mouth/Throat:     Mouth: Mucous membranes are moist.     Pharynx: Oropharynx is clear.  Eyes:     Extraocular Movements: Extraocular movements intact.     Conjunctiva/sclera: Conjunctivae normal.     Pupils: Pupils are equal, round, and reactive to light.  Cardiovascular:     Rate and Rhythm: Normal rate and regular rhythm.     Pulses: Normal pulses.     Heart sounds: Normal heart sounds.  Pulmonary:     Effort: Pulmonary effort is normal.     Breath sounds: Normal breath sounds.  Chest:  Breasts:    Right: Normal. No swelling, bleeding, inverted nipple, mass, nipple discharge, skin change or tenderness.     Left: Normal. No swelling, bleeding, inverted nipple, mass, nipple discharge, skin change or tenderness.  Abdominal:     General: Bowel sounds are normal.  There is no distension.     Palpations: Abdomen is soft. There is no mass.     Tenderness: There is no abdominal tenderness. There is no guarding or rebound.     Hernia: No hernia is present. There is no hernia in the left inguinal area or right inguinal area.  Genitourinary:    General: Normal vulva.     Exam position: Supine.     Labia:        Right: Rash present. No tenderness or lesion.        Left: Rash present. No tenderness or lesion.      Vagina: No signs of injury and foreign body. No vaginal discharge, erythema,  tenderness or bleeding.     Cervix: No cervical motion tenderness, discharge, friability, lesion or erythema.     Uterus: Normal.      Adnexa: Right adnexa normal and left adnexa normal.     Comments: Dry, rough, irritated appearing skin present   No tenderness, masses, or organomeglay present during bimanual exam .   Musculoskeletal:        General: Normal range of motion.     Cervical back: Normal range of motion and neck supple.  Lymphadenopathy:     Upper Body:     Right upper body: No axillary adenopathy.     Left upper body: No axillary adenopathy.     Lower Body: No right inguinal adenopathy. No left inguinal adenopathy.  Skin:    General: Skin is warm and dry.     Capillary Refill: Capillary refill takes less than 2 seconds.  Neurological:     General: No focal deficit present.     Mental Status: She is alert and oriented to person, place, and time.  Psychiatric:        Mood and Affect: Mood normal.        Behavior: Behavior normal.        Thought Content: Thought content normal.        Judgment: Judgment normal.    Last depression screening scores PHQ 2/9 Scores 07/21/2021 07/07/2021  PHQ - 2 Score 0 2  PHQ- 9 Score 0 11   Last fall risk screening Fall Risk  07/21/2021  Falls in the past year? 0  Number falls in past yr: 0  Injury with Fall? 0  Follow up Falls evaluation completed     Assessment & Plan    Routine Health Maintenance and  Physical Exam - annual wellness visit with pap smear performed today   There is no immunization history on file for this patient.  Health Maintenance  Topic Date Due   Hepatitis C Screening  Never done   COVID-19 Vaccine (1) 08/06/2021 (Originally 08/27/1986)   INFLUENZA VACCINE  09/24/2021 (Originally 01/25/2021)   TETANUS/TDAP  07/21/2022 (Originally 02/28/2004)   PAP SMEAR-Modifier  07/21/2024   HIV Screening  Completed   HPV VACCINES  Aged Out    Discussed health benefits of physical activity, and encouraged her to engage in regular exercise appropriate for her age and condition.  Problem List Items Addressed This Visit       Cardiovascular and Mediastinum   Migraine without aura and without status migrainosus, not intractable    Increase relpax to $RemoveB'40mg'OTtFscZO$  as needed for migraine headaches.       Relevant Medications   eletriptan (RELPAX) 40 MG tablet     Other   Abnormal TSH    Recheck TSH along with T3 and Free T4. Will notify the patient of results and refer to endocrinology as indicated       Relevant Orders   TSH   T3   T4, free   High platelet count    Recheck platelet count today       Relevant Orders   CBC   Hyperkalemia    Recheck BMP       Relevant Orders   Basic Metabolic Panel (BMET)   Other Visit Diagnoses     Well woman exam    -  Primary   Relevant Orders   Cytology - PAP( [place])   Atopic dermatitis, unspecified type       Relevant Medications   Clobetasol Prop  Emollient Base 0.05 % emollient cream   Overweight with body mass index (BMI) 25.0-29.9            Return in about 3 months (around 10/19/2021) for migraines.        Ronnell Freshwater, NP  [facility] at [facility] [phone] (phone) [phone] (fax)  [place]

## 2021-07-21 NOTE — Assessment & Plan Note (Signed)
Recheck BMP.

## 2021-07-21 NOTE — Assessment & Plan Note (Signed)
Recheck platelet count today.  

## 2021-07-21 NOTE — Assessment & Plan Note (Signed)
Recheck TSH along with T3 and Free T4. Will notify the patient of results and refer to endocrinology as indicated

## 2021-07-22 LAB — T3: T3, Total: 103 ng/dL (ref 71–180)

## 2021-07-22 LAB — BASIC METABOLIC PANEL
BUN/Creatinine Ratio: 14 (ref 9–23)
BUN: 11 mg/dL (ref 6–20)
CO2: 22 mmol/L (ref 20–29)
Calcium: 9.1 mg/dL (ref 8.7–10.2)
Chloride: 103 mmol/L (ref 96–106)
Creatinine, Ser: 0.78 mg/dL (ref 0.57–1.00)
Glucose: 84 mg/dL (ref 70–99)
Potassium: 4.2 mmol/L (ref 3.5–5.2)
Sodium: 138 mmol/L (ref 134–144)
eGFR: 101 mL/min/{1.73_m2} (ref >59)

## 2021-07-22 LAB — CBC
Hematocrit: 39.2 % (ref 34.0–46.6)
Hemoglobin: 13.2 g/dL (ref 11.1–15.9)
MCH: 31.4 pg (ref 26.6–33.0)
MCHC: 33.7 g/dL (ref 31.5–35.7)
MCV: 93 fL (ref 79–97)
Platelets: 441 10*3/uL (ref 150–450)
RBC: 4.21 x10E6/uL (ref 3.77–5.28)
RDW: 12.7 % (ref 11.7–15.4)
WBC: 6.5 10*3/uL (ref 3.4–10.8)

## 2021-07-22 LAB — T4, FREE: Free T4: 1.57 ng/dL (ref 0.82–1.77)

## 2021-07-22 LAB — TSH: TSH: 0.806 u[IU]/mL (ref 0.450–4.500)

## 2021-07-22 NOTE — Progress Notes (Signed)
Please let the patient know that recheck of all labs was normal. Thanks so much.   -HB

## 2021-07-23 LAB — CYTOLOGY - PAP
Comment: NEGATIVE
Diagnosis: NEGATIVE
High risk HPV: NEGATIVE

## 2021-10-06 ENCOUNTER — Encounter: Payer: Self-pay | Admitting: Nurse Practitioner

## 2021-10-20 ENCOUNTER — Ambulatory Visit: Payer: PRIVATE HEALTH INSURANCE | Admitting: Nurse Practitioner

## 2021-11-23 ENCOUNTER — Ambulatory Visit (INDEPENDENT_AMBULATORY_CARE_PROVIDER_SITE_OTHER): Payer: PRIVATE HEALTH INSURANCE | Admitting: Nurse Practitioner

## 2021-11-23 DIAGNOSIS — Z91199 Patient's noncompliance with other medical treatment and regimen due to unspecified reason: Secondary | ICD-10-CM

## 2021-11-23 NOTE — Progress Notes (Deleted)
Established patient visit   Patient: Desiree Murray   DOB: 01/14/1986   36 y.o. Female  MRN: [id number] Visit Date: 11/23/2021   No chief complaint on file.  Subjective    HPI  Follow up migraine headaches  -taking relpax as needed for acute headache.    Medications: Outpatient Medications Prior to Visit  Medication Sig   Clobetasol Prop Emollient Base 0.05 % emollient cream Apply small amount to effected areas bid prn   eletriptan (RELPAX) 40 MG tablet Take 1 tablet (40 mg total) by mouth as needed for migraine or headache. May repeat in 2 hours if headache persists or recurs.   ondansetron (ZOFRAN-ODT) 4 MG disintegrating tablet Take 1 tablet (4 mg total) by mouth every 8 (eight) hours as needed for nausea or vomiting.   No facility-administered medications prior to visit.    Review of Systems  Last CBC Lab Results  Component Value Date   WBC 6.5 07/21/2021   HGB 13.2 07/21/2021   HCT 39.2 07/21/2021   MCV 93 07/21/2021   MCH 31.4 07/21/2021   RDW 12.7 07/21/2021   PLT 441 35/68/6168   Last metabolic panel Lab Results  Component Value Date   GLUCOSE 84 07/21/2021   NA 138 07/21/2021   K 4.2 07/21/2021   CL 103 07/21/2021   CO2 22 07/21/2021   BUN 11 07/21/2021   CREATININE 0.78 07/21/2021   EGFR 101 07/21/2021   CALCIUM 9.1 07/21/2021   PROT 7.5 07/07/2021   ALBUMIN 4.6 07/07/2021   LABGLOB 2.9 07/07/2021   AGRATIO 1.6 07/07/2021   BILITOT 0.3 07/07/2021   ALKPHOS 65 07/07/2021   AST 17 07/07/2021   ALT 15 07/07/2021   ANIONGAP 8 01/19/2021   Last lipids Lab Results  Component Value Date   CHOL 188 07/07/2021   HDL 34 (L) 07/07/2021   LDLCALC 120 (H) 07/07/2021   TRIG 189 (H) 07/07/2021   CHOLHDL 5.5 (H) 07/07/2021   Last hemoglobin A1c Lab Results  Component Value Date   HGBA1C 5.5 07/07/2021   Last thyroid functions Lab Results  Component Value Date   TSH 0.806 07/21/2021   T3TOTAL 103 07/21/2021       Objective    There were no  vitals taken for this visit. BP Readings from Last 3 Encounters:  07/21/21 118/69  07/07/21 106/70  01/19/21 110/73    Wt Readings from Last 3 Encounters:  07/21/21 162 lb 9.6 oz (73.8 kg)  07/09/21 159 lb 8 oz (72.3 kg)  07/07/21 159 lb 8 oz (72.3 kg)    Physical Exam  ***  No results found for any visits on 11/23/21.  Assessment & Plan     Problem List Items Addressed This Visit   None    No follow-ups on file.         Ronnell Freshwater, NP  [facility] at [facility] [phone] (phone) [phone] (fax)  [place]

## 2022-04-04 NOTE — Progress Notes (Signed)
Patient was no show for appointment

## 2022-05-06 ENCOUNTER — Ambulatory Visit (HOSPITAL_COMMUNITY)
Admission: EM | Admit: 2022-05-06 | Discharge: 2022-05-06 | Disposition: A | Discharge status: Home / Self Care | Payer: PRIVATE HEALTH INSURANCE | Attending: Physician Assistant | Admitting: Physician Assistant

## 2022-05-06 ENCOUNTER — Encounter (HOSPITAL_COMMUNITY): Payer: Self-pay | Admitting: *Deleted

## 2022-05-06 DIAGNOSIS — J069 Acute upper respiratory infection, unspecified: Secondary | ICD-10-CM | POA: Insufficient documentation

## 2022-05-06 DIAGNOSIS — J0100 Acute maxillary sinusitis, unspecified: Secondary | ICD-10-CM | POA: Insufficient documentation

## 2022-05-06 DIAGNOSIS — R110 Nausea: Secondary | ICD-10-CM | POA: Insufficient documentation

## 2022-05-06 DIAGNOSIS — J111 Influenza due to unidentified influenza virus with other respiratory manifestations: Secondary | ICD-10-CM | POA: Insufficient documentation

## 2022-05-06 DIAGNOSIS — Z1152 Encounter for screening for COVID-19: Secondary | ICD-10-CM | POA: Insufficient documentation

## 2022-05-06 LAB — POC INFLUENZA A AND B ANTIGEN (URGENT CARE ONLY)
INFLUENZA A ANTIGEN, POC: NEGATIVE
INFLUENZA B ANTIGEN, POC: POSITIVE — AB

## 2022-05-06 MED ORDER — ONDANSETRON 4 MG PO TBDP
ORAL_TABLET | ORAL | Status: AC
  Filled 2022-05-06: qty 1

## 2022-05-06 MED ORDER — ONDANSETRON 4 MG PO TBDP
4.0000 mg | ORAL_TABLET | Freq: Once | ORAL | Status: AC
  Administered 2022-05-06: 4 mg via ORAL

## 2022-05-06 MED ORDER — FLUTICASONE PROPIONATE 50 MCG/ACT NA SUSP: 2.0000 | Freq: Every day | NASAL | 2 refills | Status: AC

## 2022-05-06 MED ORDER — PSEUDOEPHEDRINE HCL 30 MG PO TABS: 30.0000 mg | ORAL_TABLET | Freq: Three times a day (TID) | ORAL | 0 refills | Status: AC | PRN

## 2022-05-06 MED ORDER — OSELTAMIVIR PHOSPHATE 75 MG PO CAPS: 75.0000 mg | ORAL_CAPSULE | Freq: Two times a day (BID) | ORAL | 0 refills | Status: AC

## 2022-05-06 MED ORDER — ONDANSETRON 4 MG PO TBDP: 4.0000 mg | ORAL_TABLET | Freq: Three times a day (TID) | ORAL | 1 refills | Status: AC | PRN

## 2022-05-06 NOTE — Discharge Instructions (Addendum)
Advised take ibuprofen or Tylenol for fever, aches and pains. Advised use of Flonase nasal spray, 2 sprays each nostril once daily as this will help decrease the sinus congestion. Advised take Sudafed tablets every 8 hours as this will also help decrease the congestion. Advised to take Zofran 4 mg as needed for nausea and stomach cramping. Advised take prednisone 10 mg 3 times a day for 5 days only as this will also help to decrease the sinus congestion and drainage.  COVID test to be completed in 48 hours.  If you do not get a call from this office that indicates the test is negative.  Log onto MyChart to view the test results when it post in 48 hours. Advised follow-up PCP or return to urgent care if symptoms fail to improve.

## 2022-05-06 NOTE — ED Triage Notes (Signed)
Pt states congestion, vomiting, body aches, nausea, vomiting started Tuesday. She has taken thera flu, sudafed. She is having sinus and facial pressure. She thought she was better after taking them she felt better now today she is worse.   She took an at home Covid test at home and it was neg.

## 2022-05-06 NOTE — ED Provider Notes (Signed)
[facility]    CSN: [id number] Arrival date & time: 05/06/22  1307      History   Chief Complaint Chief Complaint  Patient presents with   Headache   Emesis   Cough   Nasal Congestion    HPI Desiree Murray is a 36 y.o. female.   36 year old female presents with sinus congestion, drainage and nausea.  Patient indicates for the past 3 days she has been having progressive upper respiratory symptoms with sinus congestion mainly maxillary and frontal, pressure, intermittent headache, rhinitis that is mainly clear.  Patient also indicates she is having postnasal drip that is causing to upset her stomach and give her nausea with 1 episode of vomiting.  Patient also indicates that she is having some mild chills, fatigue, body aches and pain.  Patient indicates mild cough and chest congestion with clear production.  She does indicate that she did a COVID test yesterday which was negative.  Patient indicates that she was using some warm compresses to the head to try to decrease her sinus congestion and pressure and this did help some.  Patient denies being around any family, friends, or coworkers with similar symptoms.  Patient is tolerating fluids well.   Headache Associated symptoms: cough, drainage, sinus pressure and vomiting   Emesis Associated symptoms: cough and headaches   Cough Associated symptoms: headaches and rhinorrhea     History reviewed. No pertinent past medical history.  Patient Active Problem List   Diagnosis Date Noted   Migraine without aura and without status migrainosus, not intractable 07/12/2021   Nausea 07/12/2021   Irregular menstrual cycle 07/12/2021   Perimenopause 07/12/2021   Mixed hyperlipidemia 07/09/2021   Abnormal TSH 07/09/2021   High platelet count 07/09/2021   Hyperkalemia 07/09/2021    History reviewed. No pertinent surgical history.  OB History   No obstetric history on file.      Home Medications    Prior to Admission  medications   Medication Sig Start Date End Date Taking? Authorizing Provider  fluticasone (FLONASE) 50 MCG/ACT nasal spray Place 2 sprays into both nostrils daily. 05/06/22  Yes Ellsworth Lennox, PA-C  oseltamivir (TAMIFLU) 75 MG capsule Take 1 capsule (75 mg total) by mouth every 12 (twelve) hours. 05/06/22  Yes Ellsworth Lennox, PA-C  pseudoephedrine (SUDAFED) 30 MG tablet Take 1 tablet (30 mg total) by mouth every 8 (eight) hours as needed for congestion. 05/06/22  Yes Ellsworth Lennox, PA-C  Clobetasol Prop Emollient Base 0.05 % emollient cream Apply small amount to effected areas bid prn 07/21/21   Carlean Jews, NP  eletriptan (RELPAX) 40 MG tablet Take 1 tablet (40 mg total) by mouth as needed for migraine or headache. May repeat in 2 hours if headache persists or recurs. 07/21/21   Carlean Jews, NP  ondansetron (ZOFRAN-ODT) 4 MG disintegrating tablet Take 1 tablet (4 mg total) by mouth every 8 (eight) hours as needed for nausea or vomiting. 05/06/22   Ellsworth Lennox, PA-C    Family History History reviewed. No pertinent family history.  Social History Social History   Tobacco Use   Smoking status: Never  Vaping Use   Vaping Use: Never used  Substance Use Topics   Alcohol use: Yes   Drug use: No     Allergies   Patient has no known allergies.   Review of Systems Review of Systems  HENT:  Positive for postnasal drip, rhinorrhea and sinus pressure.   Respiratory:  Positive for cough.  Gastrointestinal:  Positive for vomiting.  Neurological:  Positive for headaches.     Physical Exam Triage Vital Signs ED Triage Vitals  Enc Vitals Group     BP 05/06/22 1328 131/88     Pulse Rate 05/06/22 1328 83     Resp 05/06/22 1328 18     Temp 05/06/22 1328 98.3 F (36.8 C)     Temp Source 05/06/22 1328 Oral     SpO2 05/06/22 1328 97 %     Weight --      Height --      Head Circumference --      Peak Flow --      Pain Score 05/06/22 1326 8     Pain Loc --      Pain Edu? --       Excl. in GC? --    No data found.  Updated Vital Signs BP 131/88 (BP Location: Right Arm)   Pulse 83   Temp 98.3 F (36.8 C) (Oral)   Resp 18   LMP 03/27/2022   SpO2 97%   Visual Acuity Right Eye Distance:   Left Eye Distance:   Bilateral Distance:    Right Eye Near:   Left Eye Near:    Bilateral Near:     Physical Exam Constitutional:      Appearance: She is well-developed.  HENT:     Right Ear: Ear canal normal. Tympanic membrane is injected.     Left Ear: Ear canal normal. Tympanic membrane is injected.     Mouth/Throat:     Mouth: Mucous membranes are moist.     Pharynx: Oropharynx is clear. No posterior oropharyngeal erythema.  Cardiovascular:     Rate and Rhythm: Normal rate and regular rhythm.     Heart sounds: Normal heart sounds.  Pulmonary:     Effort: Pulmonary effort is normal.     Breath sounds: Normal breath sounds and air entry. No wheezing, rhonchi or rales.  Abdominal:     General: Abdomen is flat. Bowel sounds are normal.     Palpations: Abdomen is soft.     Tenderness: There is no guarding or rebound.  Lymphadenopathy:     Cervical: No cervical adenopathy.  Neurological:     Mental Status: She is alert.      UC Treatments / Results  Labs (all labs ordered are listed, but only abnormal results are displayed) Labs Reviewed  POC INFLUENZA A AND B ANTIGEN (URGENT CARE ONLY) - Abnormal; Notable for the following components:      Result Value   INFLUENZA B ANTIGEN, POC POSITIVE (*)    All other components within normal limits  SARS CORONAVIRUS 2 (TAT 6-24 HRS)    EKG   Radiology No results found.  Procedures Procedures (including critical care time)  Medications Ordered in UC Medications  ondansetron (ZOFRAN-ODT) disintegrating tablet 4 mg (4 mg Oral Given 05/06/22 1348)    Initial Impression / Assessment and Plan / UC Course  I have reviewed the triage vital signs and the nursing notes.  Pertinent labs & imaging results  that were available during my care of the patient were reviewed by me and considered in my medical decision making (see chart for details).    Plan: 1.  Screening for COVID-19 will be treated with the following: A.  Treatment will be modified depending on the COVID-19 screening test. 2.  The nausea will be treated with the following: A.  Zofran 4 mg given in the  office and also Zofran 4 mg every 6 hours prescription sent to the pharmacy to help control the nausea. 3.  The acute maxillary sinusitis will be treated with the following: A.  Flonase nasal spray, 2 sprays each nostril once daily to help decrease sinus congestion. B.  Sudafed tablets, every 8 hours to help decrease sinus congestion. 4.  Upper respiratory tract infection will be treated with the following: A.  Sudafed every 8 hours to help reduce sinus congestion and drainage. 5.  The flu will be treated with the following: A.  Tamiflu 75 mg twice daily for 5 days only to treat the flu. 5.  Advised follow-up PCP or return to urgent care if symptoms fail to improve. Final Clinical Impressions(s) / UC Diagnoses   Final diagnoses:  Nausea without vomiting  Acute non-recurrent maxillary sinusitis  Viral upper respiratory tract infection  Encounter for screening for COVID-19  Flu     Discharge Instructions      Advised take ibuprofen or Tylenol for fever, aches and pains. Advised use of Flonase nasal spray, 2 sprays each nostril once daily as this will help decrease the sinus congestion. Advised take Sudafed tablets every 8 hours as this will also help decrease the congestion. Advised to take Zofran 4 mg as needed for nausea and stomach cramping. Advised take prednisone 10 mg 3 times a day for 5 days only as this will also help to decrease the sinus congestion and drainage.  COVID test to be completed in 48 hours.  If you do not get a call from this office that indicates the test is negative.  Log onto MyChart to view the test  results when it post in 48 hours. Advised follow-up PCP or return to urgent care if symptoms fail to improve.    ED Prescriptions     Medication Sig Dispense Auth. Provider   ondansetron (ZOFRAN-ODT) 4 MG disintegrating tablet Take 1 tablet (4 mg total) by mouth every 8 (eight) hours as needed for nausea or vomiting. 30 tablet Ellsworth Lennox, PA-C   fluticasone [facility]) 50 MCG/ACT nasal spray Place 2 sprays into both nostrils daily. 11.1 mL Ellsworth Lennox, PA-C   pseudoephedrine (SUDAFED) 30 MG tablet Take 1 tablet (30 mg total) by mouth every 8 (eight) hours as needed for congestion. 30 tablet Ellsworth Lennox, PA-C   oseltamivir (TAMIFLU) 75 MG capsule Take 1 capsule (75 mg total) by mouth every 12 (twelve) hours. 10 capsule Ellsworth Lennox, PA-C      PDMP not reviewed this encounter.   Ellsworth Lennox, PA-C 05/06/22 1422

## 2022-05-07 LAB — SARS CORONAVIRUS 2 (TAT 6-24 HRS): SARS Coronavirus 2: NEGATIVE

## 2022-07-03 ENCOUNTER — Ambulatory Visit
Admission: EM | Admit: 2022-07-03 | Discharge: 2022-07-03 | Disposition: A | Discharge status: Home / Self Care | Payer: PRIVATE HEALTH INSURANCE | Attending: Urgent Care | Admitting: Urgent Care

## 2022-07-03 DIAGNOSIS — N939 Abnormal uterine and vaginal bleeding, unspecified: Secondary | ICD-10-CM

## 2022-07-03 LAB — POCT URINALYSIS DIP (MANUAL ENTRY)
Bilirubin, UA: NEGATIVE
Glucose, UA: NEGATIVE mg/dL
Ketones, POC UA: NEGATIVE mg/dL
Leukocytes, UA: NEGATIVE
Nitrite, UA: NEGATIVE
Spec Grav, UA: >=1.03 — AB (ref 1.010–1.025)
Urobilinogen, UA: 0.2 E.U./dL
pH, UA: 5.5 (ref 5.0–8.0)

## 2022-07-03 LAB — POCT URINE PREGNANCY: Preg Test, Ur: NEGATIVE

## 2022-07-03 MED ORDER — MEGESTROL ACETATE 40 MG PO TABS: 40.0000 mg | ORAL_TABLET | Freq: Every day | ORAL | 0 refills | Status: AC

## 2022-07-03 NOTE — ED Provider Notes (Signed)
[facility]  Note:  This document was prepared using Conservation officer, historic buildings and may include unintentional dictation errors.  MRN: [id number] DOB: 03-May-1986  Subjective:   Desiree Murray is a 37 y.o. female presenting for persistent irregular vaginal bleeding, spotting. Has now had 2 cycles in December and started bleeding again in the past day as if she was having another cycle. Patient had unprotected sex with a new female partner 1 week ago.  Does not have a gynecologist.  Has previously been thought to have perimenopause.  Has a history of irregular cycles for the past few years.  Does not hydrate well and has been having some low back pain. Denies fever, n/v, abdominal pain, pelvic pain, rashes, dysuria, urinary frequency, vaginal discharge.    No current facility-administered medications for this encounter.  Current Outpatient Medications:    Clobetasol Prop Emollient Base 0.05 % emollient cream, Apply small amount to effected areas bid prn, Disp: 30 g, Rfl: 2   eletriptan (RELPAX) 40 MG tablet, Take 1 tablet (40 mg total) by mouth as needed for migraine or headache. May repeat in 2 hours if headache persists or recurs., Disp: 10 tablet, Rfl: 1   fluticasone (FLONASE) 50 MCG/ACT nasal spray, Place 2 sprays into both nostrils daily., Disp: 11.1 mL, Rfl: 2   ondansetron (ZOFRAN-ODT) 4 MG disintegrating tablet, Take 1 tablet (4 mg total) by mouth every 8 (eight) hours as needed for nausea or vomiting., Disp: 30 tablet, Rfl: 1   oseltamivir (TAMIFLU) 75 MG capsule, Take 1 capsule (75 mg total) by mouth every 12 (twelve) hours., Disp: 10 capsule, Rfl: 0   pseudoephedrine (SUDAFED) 30 MG tablet, Take 1 tablet (30 mg total) by mouth every 8 (eight) hours as needed for congestion., Disp: 30 tablet, Rfl: 0   No Known Allergies  History reviewed. No pertinent past medical history.   History reviewed. No pertinent surgical history.  History reviewed. No pertinent  family history.  Social History   Tobacco Use   Smoking status: Never  Vaping Use   Vaping Use: Some days  Substance Use Topics   Alcohol use: Yes    Comment: occ   Drug use: No    ROS   Objective:   Vitals: BP 126/86 (BP Location: Left Arm)   Pulse 72   Temp 98 F (36.7 C) (Oral)   Resp 18   Ht 5\' 7"  (1.702 m)   Wt 156 lb (70.8 kg)   LMP 05/30/2022 Comment: 05/30/2022 1st period. 06/18/2022 2nd period  SpO2 98%   BMI 24.43 kg/m   Physical Exam Constitutional:      General: She is not in acute distress.    Appearance: Normal appearance. She is well-developed. She is not ill-appearing, toxic-appearing or diaphoretic.  HENT:     Head: Normocephalic and atraumatic.     Nose: Nose normal.     Mouth/Throat:     Mouth: Mucous membranes are moist.  Eyes:     General: No scleral icterus.       Right eye: No discharge.        Left eye: No discharge.     Extraocular Movements: Extraocular movements intact.     Conjunctiva/sclera: Conjunctivae normal.  Cardiovascular:     Rate and Rhythm: Normal rate.  Pulmonary:     Effort: Pulmonary effort is normal.  Abdominal:     General: Bowel sounds are normal. There is no distension.     Palpations: Abdomen is soft.  There is no mass.     Tenderness: There is no abdominal tenderness. There is no right CVA tenderness, left CVA tenderness, guarding or rebound.  Skin:    General: Skin is warm and dry.  Neurological:     General: No focal deficit present.     Mental Status: She is alert and oriented to person, place, and time.  Psychiatric:        Mood and Affect: Mood normal.        Behavior: Behavior normal.        Thought Content: Thought content normal.        Judgment: Judgment normal.     Results for orders placed or performed during the hospital encounter of 07/03/22 (from the past 24 hour(s))  POCT urinalysis dipstick     Status: Abnormal   Collection Time: 07/03/22  3:04 PM  Result Value Ref Range   Color, UA  yellow yellow   Clarity, UA clear clear   Glucose, UA negative negative mg/dL   Bilirubin, UA negative negative   Ketones, POC UA negative negative mg/dL   Spec Grav, UA >=1.030 (A) 1.010 - 1.025   Blood, UA large (A) negative   pH, UA 5.5 5.0 - 8.0   Protein Ur, POC trace (A) negative mg/dL   Urobilinogen, UA 0.2 0.2 or 1.0 E.U./dL   Nitrite, UA Negative Negative   Leukocytes, UA Negative Negative  POCT urine pregnancy     Status: None   Collection Time: 07/03/22  3:05 PM  Result Value Ref Range   Preg Test, Ur Negative Negative    Assessment and Plan :   PDMP not reviewed this encounter.  1. Abnormal uterine bleeding     Low suspicion for an urinary tract infection.  Recommended she hydrate better.  Will use a lower dose of Megace to help reset her abnormal uterine bleeding.  Will use 40 mg for 5 days.  Establish care with a gynecologist, information provided to her for this.  STI check pending. Counseled patient on potential for adverse effects with medications prescribed/recommended today, ER and return-to-clinic precautions discussed, patient verbalized understanding.    Jaynee Eagles, PA-C 07/03/22 1529

## 2022-07-03 NOTE — ED Triage Notes (Signed)
Pt states that she has some vaginal bleeding. X1 month  Pt states that she has had 2 menstrual cycles in December 2023 and is still bleeding.

## 2022-07-03 NOTE — ED Triage Notes (Signed)
Pt states that she does have some abdominal pain that is sharp that comes and goes. Pt states that she has had some lower back pain as well.

## 2022-07-05 LAB — CERVICOVAGINAL ANCILLARY ONLY

## 2022-07-06 ENCOUNTER — Telehealth (HOSPITAL_COMMUNITY): Payer: Self-pay | Admitting: Emergency Medicine

## 2022-07-06 NOTE — Telephone Encounter (Signed)
Patient will need to return for recollect of vaginal swab.  She verbalized understanding

## 2022-07-28 ENCOUNTER — Encounter: Payer: PRIVATE HEALTH INSURANCE | Admitting: Obstetrics and Gynecology

## 2022-07-30 ENCOUNTER — Ambulatory Visit
Admission: EM | Admit: 2022-07-30 | Discharge: 2022-07-30 | Disposition: A | Discharge status: Home / Self Care | Payer: PRIVATE HEALTH INSURANCE | Attending: Internal Medicine | Admitting: Internal Medicine

## 2022-07-30 DIAGNOSIS — R300 Dysuria: Secondary | ICD-10-CM

## 2022-07-30 DIAGNOSIS — B3731 Acute candidiasis of vulva and vagina: Secondary | ICD-10-CM | POA: Diagnosis not present

## 2022-07-30 LAB — POCT URINALYSIS DIP (MANUAL ENTRY)
Glucose, UA: NEGATIVE mg/dL
Nitrite, UA: NEGATIVE
Protein Ur, POC: 30 mg/dL — AB
Spec Grav, UA: >=1.03 — AB (ref 1.010–1.025)
Urobilinogen, UA: 1 E.U./dL
pH, UA: 6 (ref 5.0–8.0)

## 2022-07-30 MED ORDER — FLUCONAZOLE 150 MG PO TABS: 150.0000 mg | ORAL_TABLET | ORAL | 0 refills | Status: AC

## 2022-07-30 NOTE — ED Triage Notes (Signed)
Pt c/o dysuria started yesterday-vaginal discharge started last night-used boric acid supp-no discharge today-reports recent UTI and completed abx-NAD-steady gait

## 2022-07-30 NOTE — ED Provider Notes (Signed)
[facility]  Note:  This document was prepared using Systems analyst and may include unintentional dictation errors.  MRN: [id number] DOB: [birth year]  Subjective:   Desiree Murray is a 37 y.o. female presenting for recheck on persistent urogenital symptoms.  Started feeling generally irritation and dysuria.  Had vaginal discharge yesterday.  She used a boric acid suppository which improved the discharge today.  Had a recent UTI, was treated with Keflex 4 times daily.  Does not know about the urine culture.  No current facility-administered medications for this encounter.  Current Outpatient Medications:    Clobetasol Prop Emollient Base 0.05 % emollient cream, Apply small amount to effected areas bid prn, Disp: 30 g, Rfl: 2   eletriptan (RELPAX) 40 MG tablet, Take 1 tablet (40 mg total) by mouth as needed for migraine or headache. May repeat in 2 hours if headache persists or recurs., Disp: 10 tablet, Rfl: 1   fluticasone (FLONASE) 50 MCG/ACT nasal spray, Place 2 sprays into both nostrils daily., Disp: 11.1 mL, Rfl: 2   megestrol (MEGACE) 40 MG tablet, Take 1 tablet (40 mg total) by mouth daily., Disp: 5 tablet, Rfl: 0   ondansetron (ZOFRAN-ODT) 4 MG disintegrating tablet, Take 1 tablet (4 mg total) by mouth every 8 (eight) hours as needed for nausea or vomiting., Disp: 30 tablet, Rfl: 1   oseltamivir (TAMIFLU) 75 MG capsule, Take 1 capsule (75 mg total) by mouth every 12 (twelve) hours., Disp: 10 capsule, Rfl: 0   pseudoephedrine (SUDAFED) 30 MG tablet, Take 1 tablet (30 mg total) by mouth every 8 (eight) hours as needed for congestion., Disp: 30 tablet, Rfl: 0   No Known Allergies  History reviewed. No pertinent past medical history.   History reviewed. No pertinent surgical history.  No family history on file.  Social History   Tobacco Use   Smoking status: Never   Smokeless tobacco: Never  Vaping Use   Vaping Use: Some days  Substance  Use Topics   Alcohol use: Yes    Comment: occ   Drug use: No    ROS   Objective:   Vitals: BP 121/81 (BP Location: Left Arm)   Pulse 79   Temp 98.4 F (36.9 C) (Oral)   Resp 16   LMP 07/03/2022   SpO2 98%   Physical Exam Constitutional:      General: She is not in acute distress.    Appearance: Normal appearance. She is well-developed. She is not ill-appearing, toxic-appearing or diaphoretic.  HENT:     Head: Normocephalic and atraumatic.     Nose: Nose normal.     Mouth/Throat:     Mouth: Mucous membranes are moist.  Eyes:     General: No scleral icterus.       Right eye: No discharge.        Left eye: No discharge.     Extraocular Movements: Extraocular movements intact.     Conjunctiva/sclera: Conjunctivae normal.  Cardiovascular:     Rate and Rhythm: Normal rate.  Pulmonary:     Effort: Pulmonary effort is normal.  Abdominal:     General: Bowel sounds are normal. There is no distension.     Palpations: Abdomen is soft. There is no mass.     Tenderness: There is no abdominal tenderness. There is no right CVA tenderness, left CVA tenderness, guarding or rebound.  Skin:    General: Skin is warm and dry.  Neurological:     General:  No focal deficit present.     Mental Status: She is alert and oriented to person, place, and time.  Psychiatric:        Mood and Affect: Mood normal.        Behavior: Behavior normal.        Thought Content: Thought content normal.        Judgment: Judgment normal.     Results for orders placed or performed during the hospital encounter of 07/30/22 (from the past 24 hour(s))  POCT urinalysis dipstick     Status: Abnormal   Collection Time: 07/30/22  2:51 PM  Result Value Ref Range   Color, UA other (A) yellow   Clarity, UA cloudy (A) clear   Glucose, UA negative negative mg/dL   Bilirubin, UA small (A) negative   Ketones, POC UA large (80) (A) negative mg/dL   Spec Grav, UA >=1.030 (A) 1.010 - 1.025   Blood, UA small (A)  negative   pH, UA 6.0 5.0 - 8.0   Protein Ur, POC =30 (A) negative mg/dL   Urobilinogen, UA 1.0 0.2 or 1.0 E.U./dL   Nitrite, UA Negative Negative   Leukocytes, UA Small (1+) (A) Negative    Assessment and Plan :   PDMP not reviewed this encounter.  1. Yeast vaginitis   2. Dysuria     Recommended empiric treatment with fluconazole for yeast vaginitis.  Urine culture pending, will treat as appropriate based off of results given her recent treatment for UTI.  Patient did not hydrate at all today, recommended she hydrate very consistently as she has been doing lately.  Counseled patient on potential for adverse effects with medications prescribed/recommended today, ER and return-to-clinic precautions discussed, patient verbalized understanding.    Jaynee Eagles, [place] 07/30/22 [phone]

## 2022-07-30 NOTE — Discharge Instructions (Addendum)
Please start fluconazole to address a yeast infection due to the recent antibiotics for UTI. Make sure you hydrate very well with plain water and a quantity of 80 ounces of water a day.  Please limit drinks that are considered urinary irritants such as soda, sweet tea, coffee, energy drinks, alcohol.  These can worsen your urinary and genital symptoms but also be the source of them.  I will let you know about your urine culture and vaginal swab results through MyChart to see if we need to prescribe or change your antibiotics based off of those results.

## 2022-07-31 LAB — URINE CULTURE: Culture: <10000 — AB

## 2022-08-01 ENCOUNTER — Telehealth (HOSPITAL_COMMUNITY): Payer: Self-pay | Admitting: Emergency Medicine

## 2022-08-01 LAB — CERVICOVAGINAL ANCILLARY ONLY
Bacterial Vaginitis (gardnerella): POSITIVE — AB
Candida Glabrata: NEGATIVE
Candida Vaginitis: NEGATIVE
Chlamydia: NEGATIVE
Comment: NEGATIVE
Comment: NEGATIVE
Comment: NEGATIVE
Comment: NEGATIVE
Comment: NEGATIVE
Comment: NORMAL
Neisseria Gonorrhea: NEGATIVE
Trichomonas: POSITIVE — AB

## 2022-08-01 MED ORDER — METRONIDAZOLE 500 MG PO TABS: 500.0000 mg | ORAL_TABLET | Freq: Two times a day (BID) | ORAL | 0 refills | Status: AC

## 2022-10-11 ENCOUNTER — Emergency Department (HOSPITAL_BASED_OUTPATIENT_CLINIC_OR_DEPARTMENT_OTHER): Payer: BLUE CROSS/BLUE SHIELD

## 2022-10-11 ENCOUNTER — Other Ambulatory Visit: Payer: Self-pay

## 2022-10-11 ENCOUNTER — Emergency Department (HOSPITAL_BASED_OUTPATIENT_CLINIC_OR_DEPARTMENT_OTHER)
Admission: EM | Admit: 2022-10-11 | Discharge: 2022-10-12 | Disposition: A | Discharge status: Home / Self Care | Payer: BLUE CROSS/BLUE SHIELD | Attending: Emergency Medicine | Admitting: Emergency Medicine

## 2022-10-11 DIAGNOSIS — M25571 Pain in right ankle and joints of right foot: Secondary | ICD-10-CM | POA: Diagnosis present

## 2022-10-11 DIAGNOSIS — X501XXA Overexertion from prolonged static or awkward postures, initial encounter: Secondary | ICD-10-CM | POA: Insufficient documentation

## 2022-10-11 DIAGNOSIS — Y990 Civilian activity done for income or pay: Secondary | ICD-10-CM | POA: Diagnosis not present

## 2022-10-11 DIAGNOSIS — S93401A Sprain of unspecified ligament of right ankle, initial encounter: Secondary | ICD-10-CM

## 2022-10-11 NOTE — ED Triage Notes (Signed)
Reports R ankle pain s/p tripping and falling at work on Saturday.

## 2022-10-12 NOTE — ED Provider Notes (Signed)
[facility] Provider Note   CSN: [id number] Arrival date & time: 10/11/22  2045     History  Chief Complaint  Patient presents with   Ankle Pain    Desiree Murray is a 37 y.o. female.  Is a 37 year old female with history of migraines and hyperlipidemia.  Patient presenting with right ankle pain.  Patient was at work 3 days ago when she reports stepping awkwardly, then turning her right ankle.  She has had pain, bruising, and swelling since.  Pain is worse with ambulation.  Somewhat alleviated with ice.  The history is provided by the patient.       Home Medications Prior to Admission medications   Medication Sig Start Date End Date Taking? Authorizing Provider  Clobetasol Prop Emollient Base 0.05 % emollient cream Apply small amount to effected areas bid prn 07/21/21   Carlean Jews, NP  eletriptan (RELPAX) 40 MG tablet Take 1 tablet (40 mg total) by mouth as needed for migraine or headache. May repeat in 2 hours if headache persists or recurs. 07/21/21   Carlean Jews, NP  fluconazole (DIFLUCAN) 150 MG tablet Take 1 tablet (150 mg total) by mouth every 3 (three) days. 07/30/22   Wallis Bamberg, PA-C  fluticasone (FLONASE) 50 MCG/ACT nasal spray Place 2 sprays into both nostrils daily. 05/06/22   Ellsworth Lennox, PA-C  megestrol (MEGACE) 40 MG tablet Take 1 tablet (40 mg total) by mouth daily. 07/03/22   Wallis Bamberg, PA-C  metroNIDAZOLE (FLAGYL) 500 MG tablet Take 1 tablet (500 mg total) by mouth 2 (two) times daily. 08/01/22   Lamptey, Britta Mccreedy, MD  ondansetron (ZOFRAN-ODT) 4 MG disintegrating tablet Take 1 tablet (4 mg total) by mouth every 8 (eight) hours as needed for nausea or vomiting. 05/06/22   Ellsworth Lennox, PA-C  oseltamivir (TAMIFLU) 75 MG capsule Take 1 capsule (75 mg total) by mouth every 12 (twelve) hours. 05/06/22   Ellsworth Lennox, PA-C  pseudoephedrine (SUDAFED) 30 MG tablet Take 1 tablet (30 mg total) by mouth every 8 (eight) hours  as needed for congestion. 05/06/22   Ellsworth Lennox, PA-C      Allergies    Patient has no known allergies.    Review of Systems   Review of Systems  All other systems reviewed and are negative.   Physical Exam Updated Vital Signs BP 114/82   Pulse 79   Temp 98.8 F (37.1 C)   Resp 18   Ht  (1.651 m)   Wt 72.6 kg   LMP 09/16/2022 (Exact Date)   SpO2 100%   BMI 26.63 kg/m  Physical Exam Vitals and nursing note reviewed.  Constitutional:      General: She is not in acute distress.    Appearance: Normal appearance.  HENT:     Head: Normocephalic and atraumatic.  Pulmonary:     Effort: Pulmonary effort is normal.  Musculoskeletal:     Comments: The right ankle is noted to have some ecchymosis overlying the lateral malleolus and extending into the proximal foot.  There is no deformity otherwise.  She has good range of motion.  DP pulses are palpable and motor and sensation are intact throughout the entire foot.  Skin:    General: Skin is warm and dry.  Neurological:     Mental Status: She is alert.     ED Results / Procedures / Treatments   Labs (all labs ordered are listed, but only abnormal results are  displayed) Labs Reviewed - No data to display  EKG None  Radiology DG Ankle Complete Right  Result Date: 10/11/2022 CLINICAL DATA:  Fall , right ankle pain EXAM: RIGHT ANKLE - COMPLETE 3+ VIEW COMPARISON:  None Available. FINDINGS: There is no evidence of fracture, dislocation, or joint effusion. There is no evidence of arthropathy or other focal bone abnormality. Soft tissues are unremarkable. IMPRESSION: Negative. Electronically Signed   By: Helyn Numbers M.D.   On: 10/11/2022 21:12    Procedures Procedures    Medications Ordered in ED Medications - No data to display  ED Course/ Medical Decision Making/ A&P  X-rays are negative.  Injury to be treated as a sprain with compression, rest, and gradual reintroduction of activity over the next 2  weeks.  Final Clinical Impression(s) / ED Diagnoses Final diagnoses:  None    Rx / DC Orders ED Discharge Orders     None         Geoffery Lyons, MD 10/12/22 [id number]

## 2022-10-12 NOTE — Discharge Instructions (Signed)
Wear Ace bandage for comfort and support.  Take ibuprofen 600 mg every 6 hours as needed for pain.  Weightbearing as tolerated.  Follow-up with primary doctor if not improving in the next 1 to 2 weeks.

## 2022-10-23 IMAGING — CR DG CHEST 2V
2 series · 2 of 2 positions shown · non-contrast
Comparison: 07/18/2012

CLINICAL DATA: Chest pain, headache and body

EXAM:
CHEST - 2 VIEW

[w chest pa]
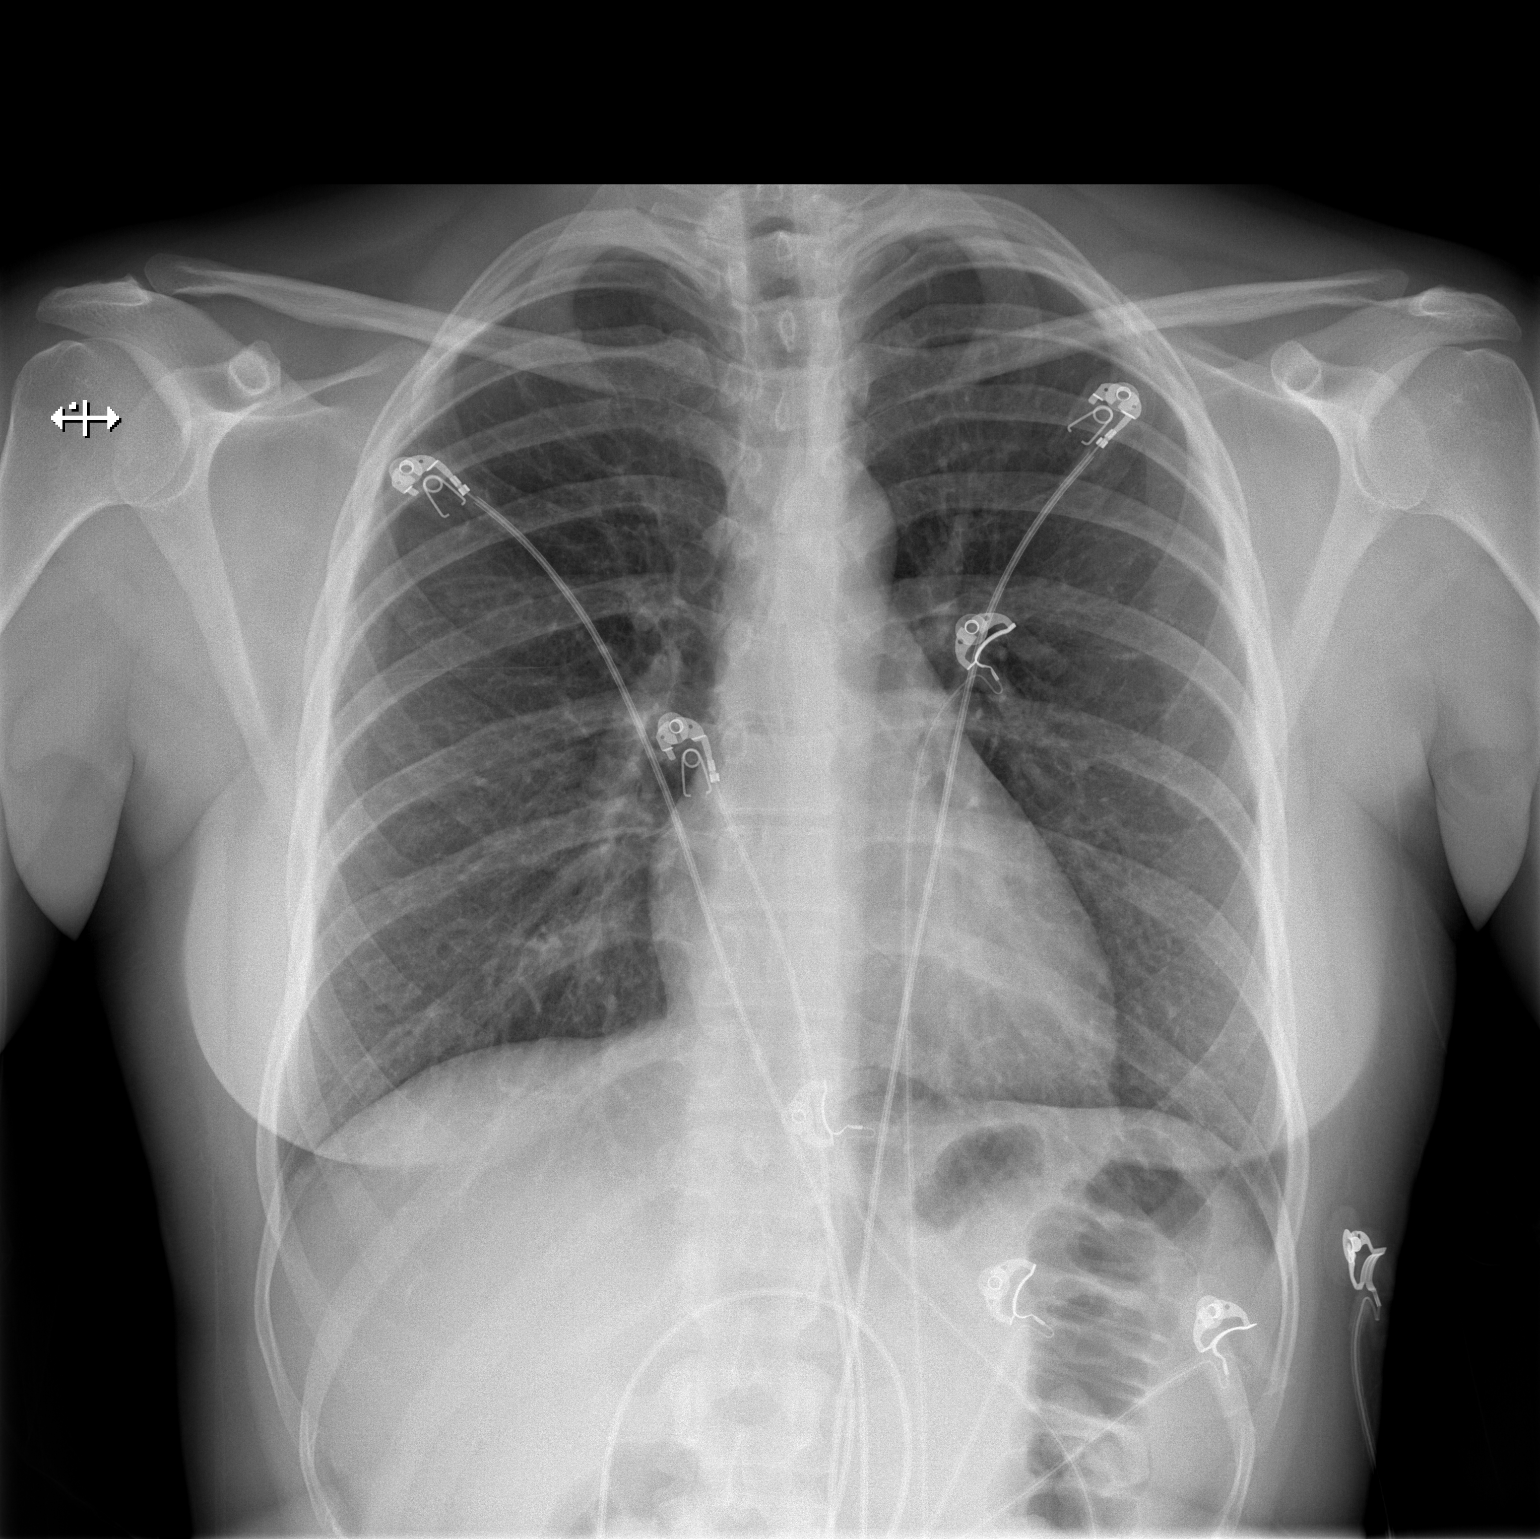

[w chest lat]
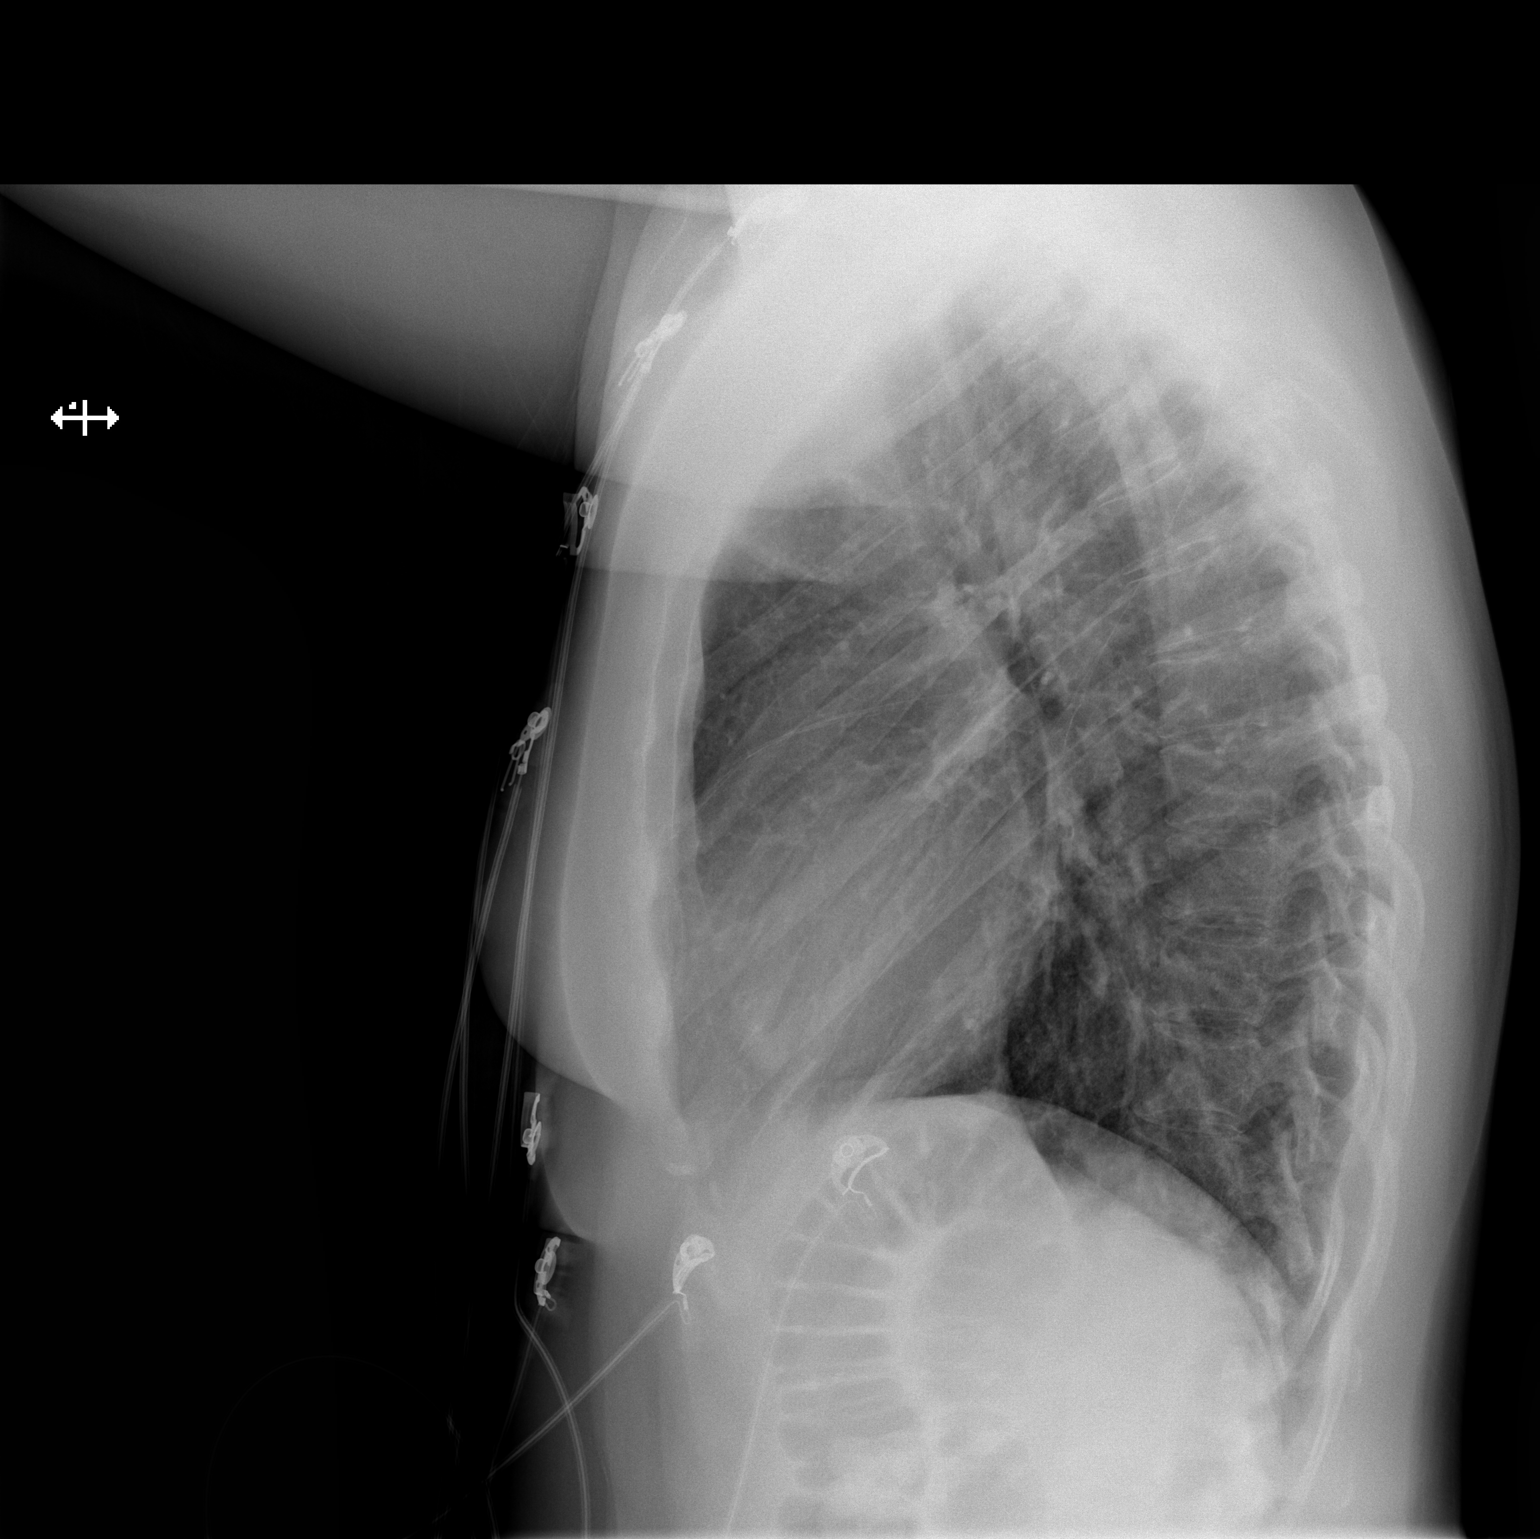

[2 of 2 positions shown; findings below may reference images not displayed]

FINDINGS: The heart size and mediastinal contours are within normal limits.
Both lungs are clear. The visualized skeletal structures are
unremarkable.
IMPRESSION: No active cardiopulmonary disease.
